# Patient Record
Sex: Female | Born: 1990 | Race: White | Hispanic: No | Marital: Single | Smoking: Current every day smoker
Health system: Southern US, Community
[De-identification: ages and names within clinical notes are randomized; demographics above are authoritative.]

## PROBLEM LIST (undated history)

## (undated) DIAGNOSIS — F419 Anxiety disorder, unspecified: Secondary | ICD-10-CM

## (undated) DIAGNOSIS — F329 Major depressive disorder, single episode, unspecified: Secondary | ICD-10-CM

## (undated) DIAGNOSIS — F32A Depression, unspecified: Secondary | ICD-10-CM

## (undated) HISTORY — DX: Major depressive disorder, single episode, unspecified: F32.9

## (undated) HISTORY — DX: Depression, unspecified: F32.A

## (undated) HISTORY — DX: Anxiety disorder, unspecified: F41.9

---
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
## 2009-11-22 ENCOUNTER — Ambulatory Visit: Payer: Self-pay | Admitting: Family Medicine

## 2010-02-21 ENCOUNTER — Encounter: Payer: Self-pay | Admitting: Family Medicine

## 2010-02-21 ENCOUNTER — Ambulatory Visit: Payer: Self-pay | Admitting: Obstetrics & Gynecology

## 2010-02-21 DIAGNOSIS — Z3480 Encounter for supervision of other normal pregnancy, unspecified trimester: Secondary | ICD-10-CM

## 2010-02-21 LAB — CONVERTED CEMR LAB
Antibody Screen: NEGATIVE
Basophils Relative: 0 % (ref 0–1)
Eosinophils Absolute: 0.1 10*3/uL (ref 0.0–0.7)
Eosinophils Relative: 2 % (ref 0–5)
HCT: 37 % (ref 36.0–46.0)
Lymphs Abs: 1.2 10*3/uL (ref 0.7–4.0)
MCHC: 33.8 g/dL (ref 30.0–36.0)
MCV: 92.5 fL (ref 78.0–100.0)
Monocytes Relative: 8 % (ref 3–12)
Neutrophils Relative %: 72 % (ref 43–77)
Platelets: 192 10*3/uL (ref 150–400)
Rh Type: NEGATIVE
WBC: 6.6 10*3/uL (ref 4.0–10.5)

## 2010-02-28 ENCOUNTER — Ambulatory Visit (HOSPITAL_COMMUNITY): Admission: RE | Admit: 2010-02-28 | Discharge: 2010-02-28 | Payer: Self-pay | Admitting: Family Medicine

## 2010-03-07 ENCOUNTER — Ambulatory Visit: Payer: Self-pay | Admitting: Family Medicine

## 2010-03-08 ENCOUNTER — Encounter: Payer: Self-pay | Admitting: Family Medicine

## 2010-03-08 LAB — CONVERTED CEMR LAB
Chlamydia, Swab/Urine, PCR: NEGATIVE
GC Probe Amp, Urine: NEGATIVE

## 2010-04-10 ENCOUNTER — Ambulatory Visit: Payer: Self-pay | Admitting: Obstetrics & Gynecology

## 2010-05-08 ENCOUNTER — Encounter: Payer: Self-pay | Admitting: Family Medicine

## 2010-05-08 ENCOUNTER — Ambulatory Visit: Payer: Self-pay | Admitting: Obstetrics & Gynecology

## 2010-05-10 ENCOUNTER — Ambulatory Visit (HOSPITAL_COMMUNITY): Admission: RE | Admit: 2010-05-10 | Discharge: 2010-05-10 | Payer: Self-pay | Admitting: Family Medicine

## 2010-06-13 ENCOUNTER — Ambulatory Visit: Payer: Self-pay | Admitting: Obstetrics & Gynecology

## 2010-07-10 ENCOUNTER — Encounter: Payer: Self-pay | Admitting: Family Medicine

## 2010-07-10 ENCOUNTER — Ambulatory Visit: Payer: Self-pay | Admitting: Obstetrics & Gynecology

## 2010-07-10 LAB — CONVERTED CEMR LAB
Antibody Screen: NEGATIVE
HIV: NONREACTIVE
Platelets: 214 10*3/uL (ref 150–400)
RDW: 13.2 % (ref 11.5–15.5)
WBC: 8.6 10*3/uL (ref 4.0–10.5)

## 2010-07-31 ENCOUNTER — Ambulatory Visit: Admit: 2010-07-31 | Payer: Self-pay | Admitting: Family Medicine

## 2010-08-08 ENCOUNTER — Ambulatory Visit
Admission: RE | Admit: 2010-08-08 | Discharge: 2010-08-08 | Payer: Self-pay | Source: Home / Self Care | Attending: Family Medicine | Admitting: Family Medicine

## 2010-08-16 ENCOUNTER — Observation Stay: Payer: Self-pay | Admitting: Obstetrics and Gynecology

## 2010-08-23 ENCOUNTER — Encounter: Payer: Medicaid Other | Admitting: Obstetrics & Gynecology

## 2010-08-23 DIAGNOSIS — O99330 Smoking (tobacco) complicating pregnancy, unspecified trimester: Secondary | ICD-10-CM

## 2010-08-23 DIAGNOSIS — Z3480 Encounter for supervision of other normal pregnancy, unspecified trimester: Secondary | ICD-10-CM

## 2010-09-11 ENCOUNTER — Ambulatory Visit (HOSPITAL_COMMUNITY)
Admission: RE | Admit: 2010-09-11 | Discharge: 2010-09-11 | Disposition: A | Discharge status: Home / Self Care | Payer: Medicaid Other | Source: Ambulatory Visit | Attending: Obstetrics & Gynecology | Admitting: Obstetrics & Gynecology

## 2010-09-11 ENCOUNTER — Encounter: Payer: Medicaid Other | Admitting: Obstetrics and Gynecology

## 2010-09-11 ENCOUNTER — Other Ambulatory Visit: Payer: Self-pay | Admitting: Obstetrics & Gynecology

## 2010-09-11 ENCOUNTER — Inpatient Hospital Stay (HOSPITAL_COMMUNITY)
Admission: AD | Admit: 2010-09-11 | Discharge: 2010-09-11 | Disposition: A | Discharge status: Home / Self Care | Payer: Medicaid Other | Source: Ambulatory Visit | Attending: Obstetrics & Gynecology | Admitting: Obstetrics & Gynecology

## 2010-09-11 DIAGNOSIS — IMO0002 Reserved for concepts with insufficient information to code with codable children: Secondary | ICD-10-CM

## 2010-09-11 DIAGNOSIS — Z3689 Encounter for other specified antenatal screening: Secondary | ICD-10-CM | POA: Insufficient documentation

## 2010-09-11 DIAGNOSIS — O368390 Maternal care for abnormalities of the fetal heart rate or rhythm, unspecified trimester, not applicable or unspecified: Secondary | ICD-10-CM | POA: Insufficient documentation

## 2010-09-11 DIAGNOSIS — O288 Other abnormal findings on antenatal screening of mother: Secondary | ICD-10-CM

## 2010-09-11 DIAGNOSIS — Z3480 Encounter for supervision of other normal pregnancy, unspecified trimester: Secondary | ICD-10-CM

## 2010-09-12 ENCOUNTER — Other Ambulatory Visit (HOSPITAL_COMMUNITY): Payer: Medicaid Other

## 2010-09-17 ENCOUNTER — Encounter: Payer: Medicaid Other | Admitting: Obstetrics & Gynecology

## 2010-09-17 ENCOUNTER — Encounter: Payer: Self-pay | Admitting: Obstetrics and Gynecology

## 2010-09-17 DIAGNOSIS — Z3480 Encounter for supervision of other normal pregnancy, unspecified trimester: Secondary | ICD-10-CM

## 2010-09-17 LAB — CONVERTED CEMR LAB: Chlamydia, DNA Probe: NEGATIVE

## 2010-09-18 ENCOUNTER — Encounter: Payer: Self-pay | Admitting: Obstetrics and Gynecology

## 2010-09-26 ENCOUNTER — Encounter: Payer: Medicaid Other | Admitting: Obstetrics & Gynecology

## 2010-09-26 DIAGNOSIS — Z3480 Encounter for supervision of other normal pregnancy, unspecified trimester: Secondary | ICD-10-CM

## 2010-09-26 DIAGNOSIS — O99330 Smoking (tobacco) complicating pregnancy, unspecified trimester: Secondary | ICD-10-CM

## 2010-09-30 ENCOUNTER — Inpatient Hospital Stay: Payer: Self-pay

## 2010-09-30 ENCOUNTER — Observation Stay: Payer: Self-pay | Admitting: Obstetrics and Gynecology

## 2010-09-30 ENCOUNTER — Inpatient Hospital Stay (HOSPITAL_COMMUNITY): Payer: Medicaid Other

## 2010-09-30 ENCOUNTER — Inpatient Hospital Stay (HOSPITAL_COMMUNITY)
Admission: AD | Admit: 2010-09-30 | Discharge: 2010-09-30 | Disposition: A | Discharge status: Home / Self Care | Payer: Medicaid Other | Source: Ambulatory Visit | Attending: Obstetrics & Gynecology | Admitting: Obstetrics & Gynecology

## 2010-09-30 DIAGNOSIS — O99891 Other specified diseases and conditions complicating pregnancy: Secondary | ICD-10-CM | POA: Insufficient documentation

## 2010-10-02 ENCOUNTER — Encounter: Payer: Medicaid Other | Admitting: Obstetrics and Gynecology

## 2010-10-04 LAB — PATHOLOGY REPORT

## 2010-11-06 ENCOUNTER — Ambulatory Visit: Payer: Medicaid Other | Admitting: Obstetrics and Gynecology

## 2010-11-06 ENCOUNTER — Other Ambulatory Visit (HOSPITAL_COMMUNITY)
Admission: RE | Admit: 2010-11-06 | Discharge: 2010-11-06 | Disposition: A | Discharge status: Home / Self Care | Payer: Medicaid Other | Source: Ambulatory Visit | Attending: Obstetrics & Gynecology | Admitting: Obstetrics & Gynecology

## 2010-11-06 DIAGNOSIS — Z01419 Encounter for gynecological examination (general) (routine) without abnormal findings: Secondary | ICD-10-CM

## 2010-11-06 DIAGNOSIS — Z113 Encounter for screening for infections with a predominantly sexual mode of transmission: Secondary | ICD-10-CM | POA: Insufficient documentation

## 2010-11-27 NOTE — Assessment & Plan Note (Signed)
NAMEMODELL, FENDRICK           ACCOUNT NO.:  [id number]   MEDICAL RECORD NO.:  [id number]          PATIENT TYPE:  POB   LOCATION:  [facility] at [facility]         FACILITY:  [facility]   PHYSICIAN:  Scheryl Darter, MD       DATE OF BIRTH:  03/01/1991   DATE OF SERVICE:  11/23/2008                                  CLINIC NOTE   The patient was seen on May 3 at [facility] due to  left lower quadrant pain.  She said her urine was quite dark.  She was  treated for urinary tract infection with Cipro.  Pain is much better.  She still has some slight burning with urination.  She denies any  discharge.  The patient is a 20 year old, white female, gravida 0.  Last  menstrual period November 07, 2008.  She currently occasionally uses  condoms for birth control.   PAST MEDICAL HISTORY:  Bladder infections.   PAST SURGICAL HISTORY:  None.   SOCIAL HISTORY:  The patient is single and smokes a pack of cigarettes a  day for last 2 years.  She denies any abuse or illicit drug use or  alcohol use.   FAMILY HISTORY:  Diabetes, heart disease, hypertension, and blood clots.   ALLERGIES:  No known drug allergies.   MEDICATIONS:  None.   PHYSICAL EXAMINATION:  GENERAL:  The patient is in acute distress.  VITAL SIGNS:  Weight is 135 pounds, BP 111/72, pulse 80.  ABDOMEN:  Nondistended, nontender.  EXTERNAL GENITALIA:  Vagina and cervix showed a whitish discharge that  may represent yeast.  No cervical motion tenderness.  Uterus normal in  size.  No adnexal masses or tenderness.   We are unable to get the records from [facility] to  review during her visit.  I need to followup an ultrasound that showed a  left ovarian cyst.  The wet mount is pending as of urine culture.  As  her exam seemed to be consistent with vaginal yeast, I gave her  prescription for Diflucan 150 mg 1 tablet.  We will be in touch with the  results of her testing.  A gonorrhea and Chlamydia probes  were obtained  today.  I advised her that she should quit smoking and also she should  consider reliable method of birth control as she is likely to desire  pregnancy.      Scheryl Darter, MD     JA/MEDQ  D:  11/23/2008  T:  11/23/2008  Job:  [id number]

## 2011-06-27 IMAGING — US US OB COMP LESS 14 WK
1 series · 14 of 28 positions shown · non-contrast
Comparison: none

OBSTETRICAL ULTRASOUND:
 This ultrasound was performed in The [HOSPITAL], and the [facility] report will be stored to [REDACTED] PACS.  This report is also available in [HOSPITAL]?s accessANYware.

[Series 1: us ob comp less 14 wks · 14 of 34 slices shown]
[im 2/34]
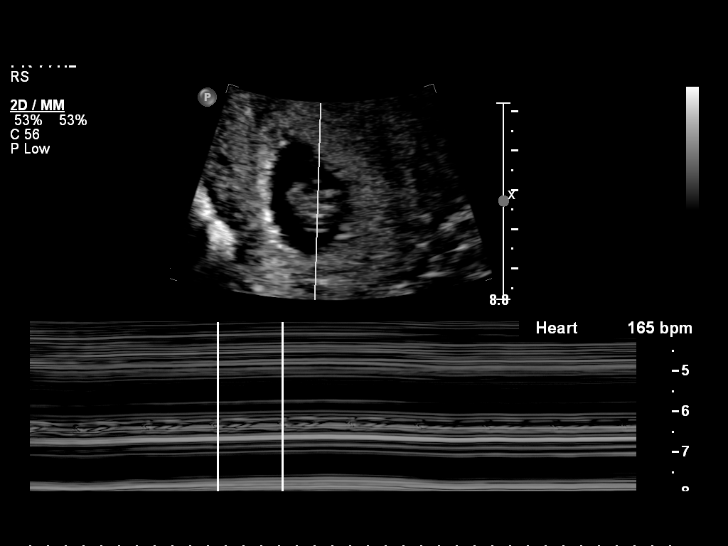
[im 4/34]
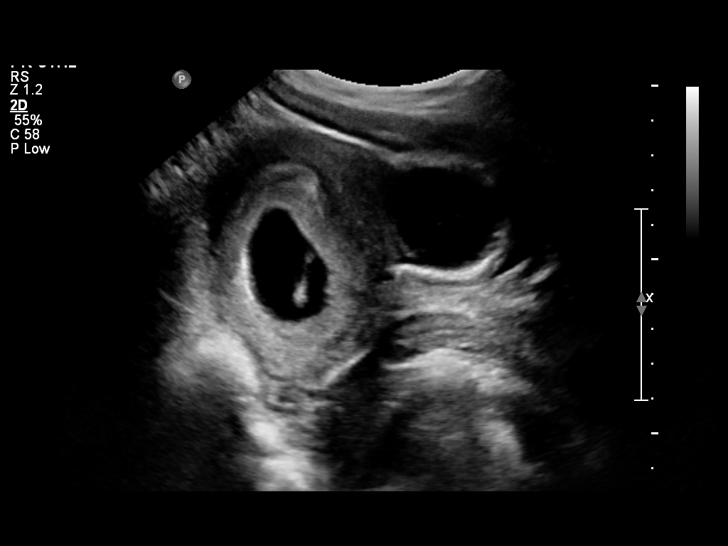
[im 7/34]
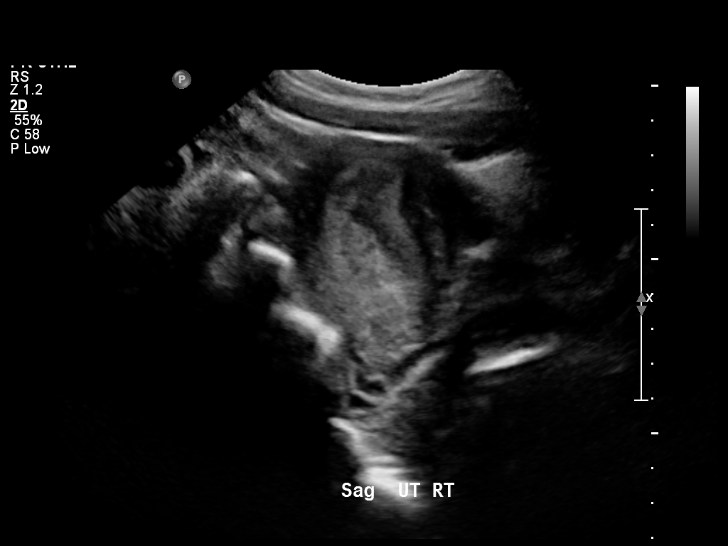
[im 9/34]
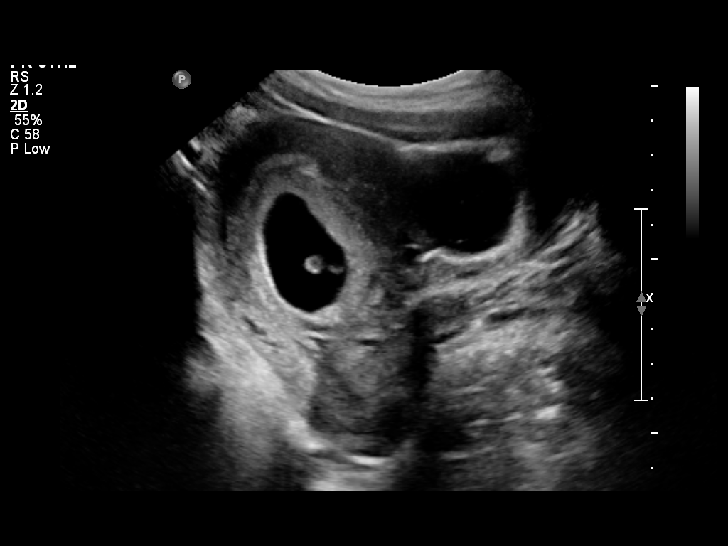
[im 12/34]
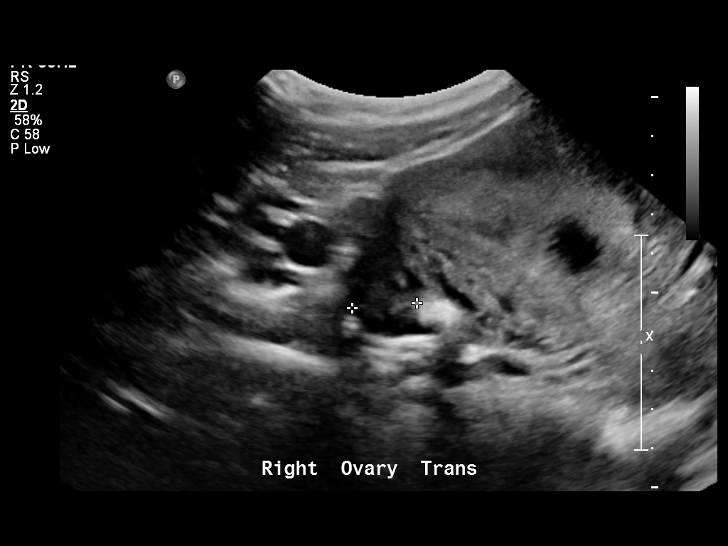
[im 14/34]
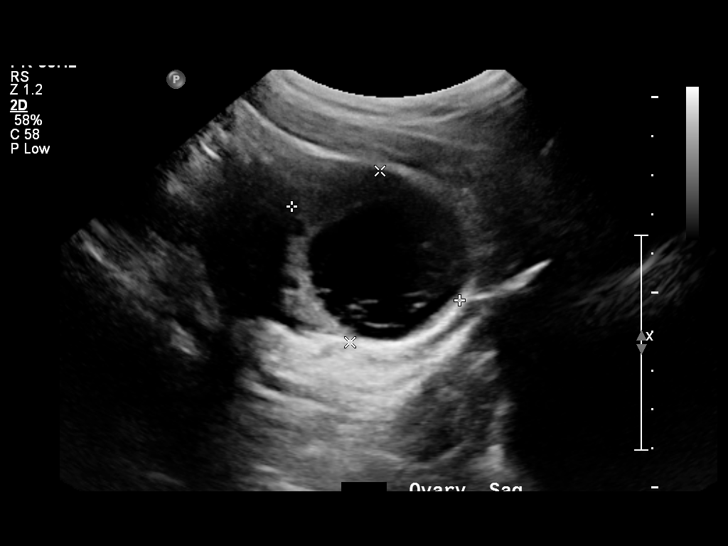
[im 16/34]
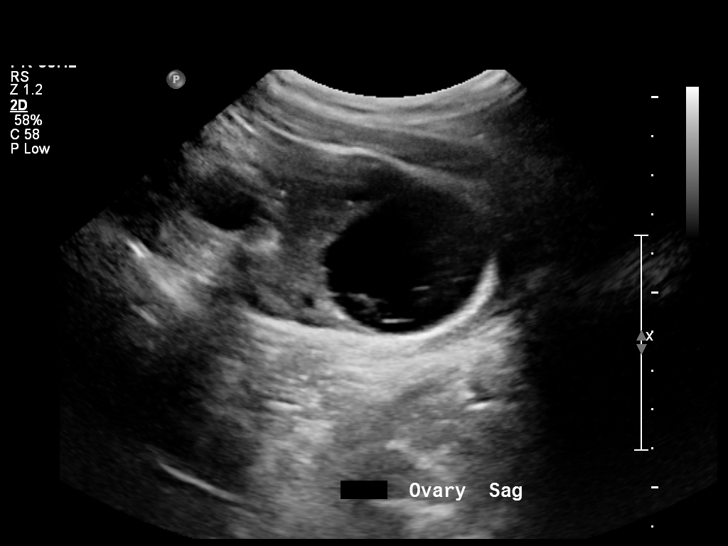
[im 19/34]
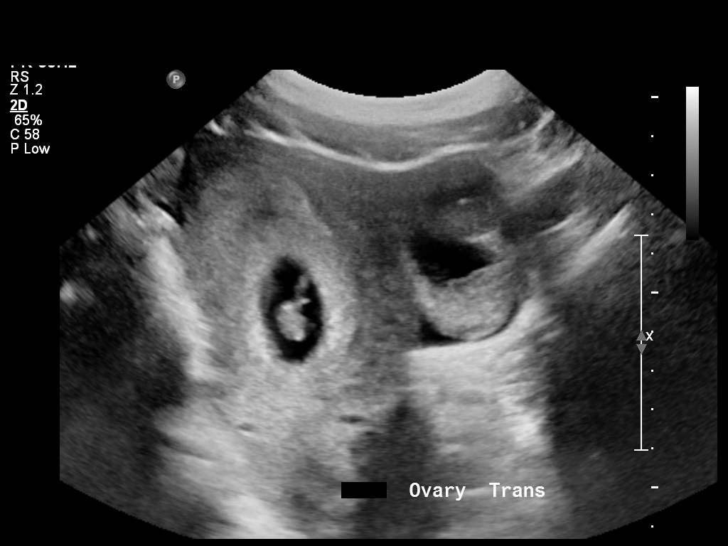
[im 21/34]
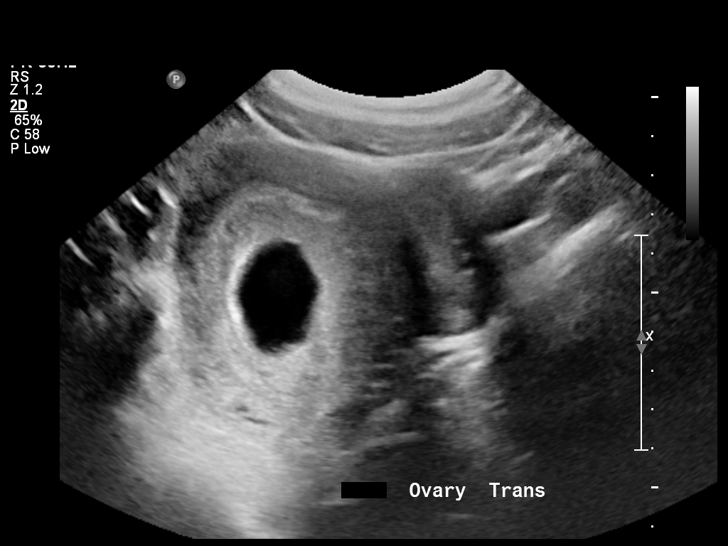
[im 24/34]
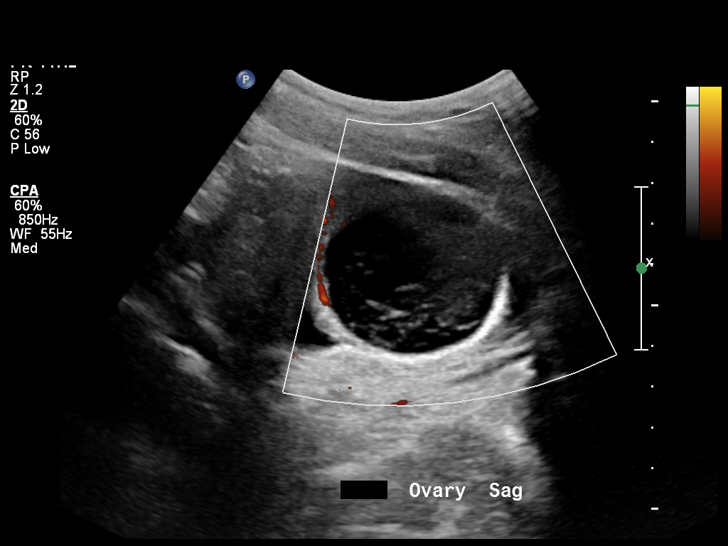
[im 26/34]
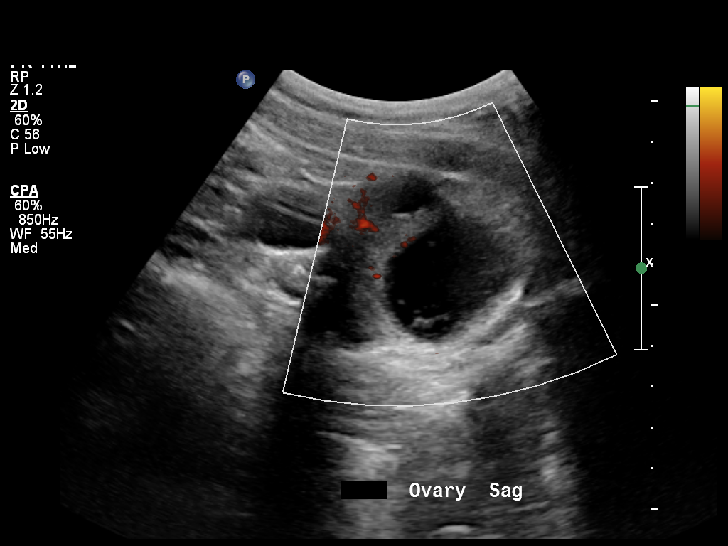
[im 29/34]
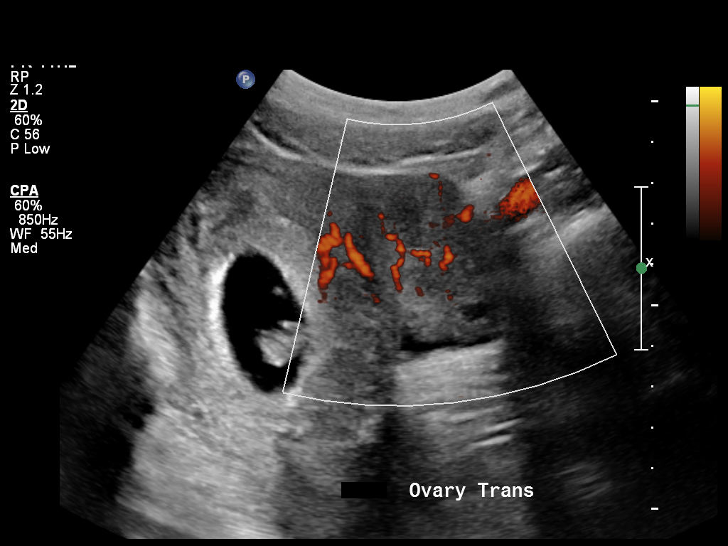
[im 31/34]
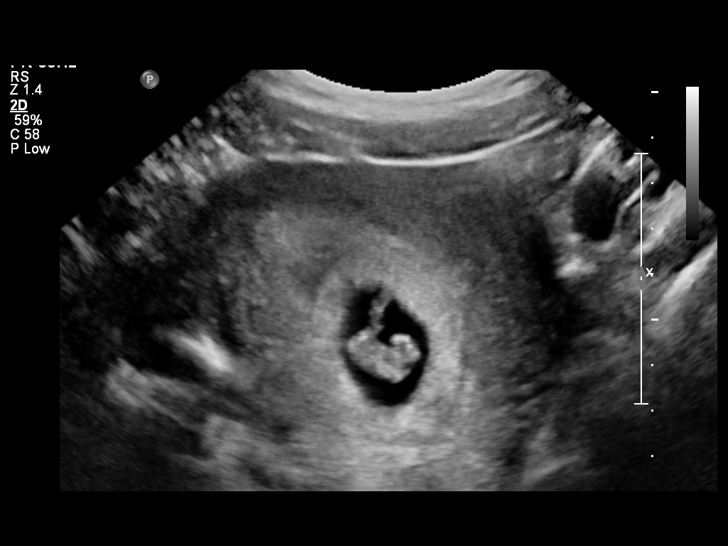
[im 34/34]
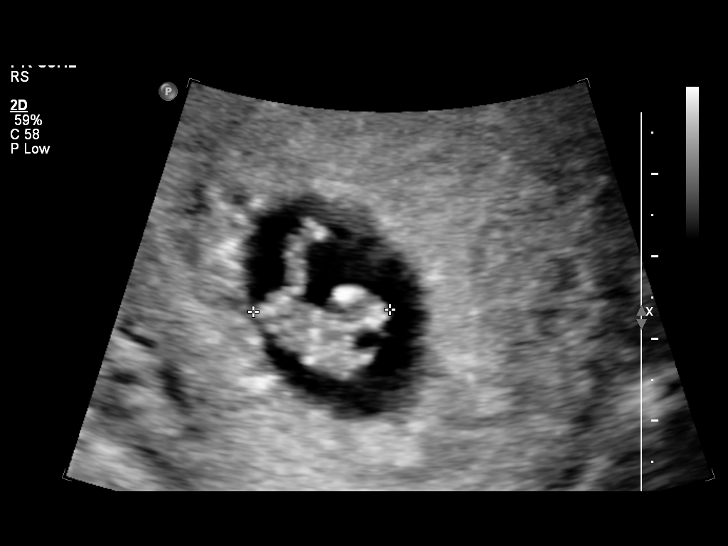

[14 of 28 positions shown; findings below may reference images not displayed]

IMPRESSION: [facility] has also been faxed to the ordering physician.

## 2011-09-01 ENCOUNTER — Emergency Department (HOSPITAL_COMMUNITY)
Admission: EM | Admit: 2011-09-01 | Discharge: 2011-09-02 | Disposition: A | Discharge status: Home / Self Care | Payer: BC Managed Care – PPO | Attending: Emergency Medicine | Admitting: Emergency Medicine

## 2011-09-01 ENCOUNTER — Encounter (HOSPITAL_COMMUNITY): Payer: Self-pay | Admitting: *Deleted

## 2011-09-01 DIAGNOSIS — F17200 Nicotine dependence, unspecified, uncomplicated: Secondary | ICD-10-CM | POA: Insufficient documentation

## 2011-09-01 DIAGNOSIS — R109 Unspecified abdominal pain: Secondary | ICD-10-CM | POA: Insufficient documentation

## 2011-09-01 LAB — URINALYSIS, ROUTINE W REFLEX MICROSCOPIC
Bilirubin Urine: NEGATIVE
Glucose, UA: NEGATIVE mg/dL
Hgb urine dipstick: NEGATIVE
Ketones, ur: NEGATIVE mg/dL
Leukocytes, UA: NEGATIVE
Protein, ur: NEGATIVE mg/dL
pH: 6.5 (ref 5.0–8.0)

## 2011-09-01 NOTE — ED Notes (Signed)
Pt states that she noted her "stomach hurt" in the lower aspect earlier today.  Pt states that the pain has now moved to the upper aspect.  Pt denies N/V/D.  Pt states that the pain started circa 1700 today.

## 2011-09-02 LAB — COMPREHENSIVE METABOLIC PANEL
Albumin: 4.1 g/dL (ref 3.5–5.2)
Alkaline Phosphatase: 50 U/L (ref 39–117)
BUN: 13 mg/dL (ref 6–23)
CO2: 27 mEq/L (ref 19–32)
Chloride: 102 mEq/L (ref 96–112)
GFR calc non Af Amer: >90 mL/min (ref >90)
Glucose, Bld: 87 mg/dL (ref 70–99)
Potassium: 5.1 mEq/L (ref 3.5–5.1)
Total Bilirubin: 0.2 mg/dL — ABNORMAL LOW (ref 0.3–1.2)

## 2011-09-02 LAB — CBC
MCV: 89.6 fL (ref 78.0–100.0)
Platelets: 248 10*3/uL (ref 150–400)
RDW: 12.7 % (ref 11.5–15.5)
WBC: 7.2 10*3/uL (ref 4.0–10.5)

## 2011-09-02 LAB — DIFFERENTIAL
Basophils Absolute: 0.1 10*3/uL (ref 0.0–0.1)
Eosinophils Absolute: 0.3 10*3/uL (ref 0.0–0.7)
Eosinophils Relative: 4 % (ref 0–5)
Lymphocytes Relative: 39 % (ref 12–46)
Neutrophils Relative %: 49 % (ref 43–77)

## 2011-09-02 NOTE — Discharge Instructions (Signed)
Abdominal Pain (Nonspecific) Your exam might not show the exact reason you have abdominal pain. Since there are many different causes of abdominal pain, another checkup and more tests may be needed. It is very important to follow up for lasting (persistent) or worsening symptoms. A possible cause of abdominal pain in any person who still has his or her appendix is acute appendicitis. Appendicitis is often hard to diagnose. Normal blood tests, urine tests, ultrasound, and CT scans do not completely rule out early appendicitis or other causes of abdominal pain. Sometimes, only the changes that happen over time will allow appendicitis and other causes of abdominal pain to be determined. Other potential problems that may require surgery may also take time to become more apparent. Because of this, it is important that you follow all of the instructions below. HOME CARE INSTRUCTIONS   Rest as much as possible.   Do not eat solid food until your pain is gone.   While adults or children have pain: A diet of water, weak decaffeinated tea, broth or bouillon, gelatin, oral rehydration solutions (ORS), frozen ice pops, or ice chips may be helpful.   When pain is gone in adults or children: Start a light diet (dry toast, crackers, applesauce, or white rice). Increase the diet slowly as long as it does not bother you. Eat no dairy products (including cheese and eggs) and no spicy, fatty, fried, or high-fiber foods.   Use no alcohol, caffeine, or cigarettes.   Take your regular medicines unless your caregiver told you not to.   Take any prescribed medicine as directed.   Only take over-the-counter or prescription medicines for pain, discomfort, or fever as directed by your caregiver. Do not give aspirin to children.  If your caregiver has given you a follow-up appointment, it is very important to keep that appointment. Not keeping the appointment could result in a permanent injury and/or lasting (chronic) pain  and/or disability. If there is any problem keeping the appointment, you must call to reschedule.  SEEK IMMEDIATE MEDICAL CARE IF:   Your pain is not gone in 24 hours.   Your pain becomes worse, changes location, or feels different.   You or your child has an oral temperature above 102 F (38.9 C), not controlled by medicine.   Your baby is older than 3 months with a rectal temperature of 102 F (38.9 C) or higher.   Your baby is 56 months old or younger with a rectal temperature of 100.4 F (38 C) or higher.   You have shaking chills.   You keep throwing up (vomiting) or cannot drink liquids.   There is blood in your vomit or you see blood in your bowel movements.   Your bowel movements become dark or black.   You have frequent bowel movements.   Your bowel movements stop (become blocked) or you cannot pass gas.   You have bloody, frequent, or painful urination.   You have yellow discoloration in the skin or whites of the eyes.   Your stomach becomes bloated or bigger.   You have dizziness or fainting.   You have chest or back pain.  MAKE SURE YOU:   Understand these instructions.   Will watch your condition.   Will get help right away if you are not doing well or get worse.  Document Released: 07/01/2005 Document Revised: 03/13/2011 Document Reviewed: 05/29/2009 [facility] Patient Information 2012 [place], [place]. Denies your labs are normal.  Urine is normal at the time of  your exam your pain result a few again developed pain, fever, nausea, vomiting, diarrhea.  Please return for further evaluation

## 2011-09-02 NOTE — ED Notes (Signed)
A rainbow is in lab on patient.

## 2011-09-02 NOTE — ED Notes (Signed)
Patient stable upon discharge.  

## 2011-09-02 NOTE — ED Provider Notes (Signed)
History     CSN: [id number]  Arrival date & time 09/01/11  2212   First MD Initiated Contact with Patient 09/02/11 0225      Chief Complaint  Patient presents with  . Abdominal Pain    (Consider location/radiation/quality/duration/timing/severity/associated sxs/prior treatment) Patient is a 21 y.o. female presenting with abdominal pain. The history is provided by the patient.  Abdominal Pain The primary symptoms of the illness include abdominal pain. The primary symptoms of the illness do not include nausea, vomiting, diarrhea, dysuria or vaginal discharge. The current episode started 6 to 12 hours ago. The problem has been resolved.    History reviewed. No pertinent past medical history.  History reviewed. No pertinent past surgical history.  No family history on file.  History  Substance Use Topics  . Smoking status: Current Everyday Smoker -- 1.0 packs/day for 5 years    Types: Cigarettes  . Smokeless tobacco: Not on file  . Alcohol Use: No    OB History    Grav Para Term Preterm Abortions TAB SAB Ect Mult Living                  Review of Systems  Gastrointestinal: Positive for abdominal pain. Negative for nausea, vomiting and diarrhea.  Genitourinary: Negative for dysuria and vaginal discharge.    Allergies  Review of patient's allergies indicates no known allergies.  Home Medications  No current outpatient prescriptions on file.  BP 96/52  Pulse 61  Temp(Src) 98.1 F (36.7 C) (Oral)  Resp 17  Ht 5\' 5"  (1.651 m)  Wt 146 lb 12.8 oz (66.588 kg)  BMI 24.43 kg/m2  SpO2 98%  LMP 09/01/2011  Physical Exam  Constitutional: She is oriented to person, place, and time. She appears well-developed and well-nourished.  HENT:  Head: Normocephalic.  Eyes: Pupils are equal, round, and reactive to light.  Neck: Normal range of motion.  Cardiovascular: Normal rate.   Pulmonary/Chest: Effort normal.  Abdominal: Soft. Bowel sounds are normal. She exhibits no  distension. There is no tenderness.  Neurological: She is alert and oriented to person, place, and time.  Skin: Skin is warm and dry.    ED Course  Procedures (including critical care time)  Labs Reviewed  COMPREHENSIVE METABOLIC PANEL - Abnormal; Notable for the following:    Total Bilirubin 0.2 (*)    All other components within normal limits  URINALYSIS, ROUTINE W REFLEX MICROSCOPIC  PREGNANCY, URINE  CBC  DIFFERENTIAL   No results found.   1. Abdominal pain of unknown etiology       MDM  Resolved, migratory abdominal pain        Arman Filter, NP 09/02/11 0406  Arman Filter, NP 09/02/11 0406  Medical screening examination/treatment/procedure(s) were performed by non-physician practitioner and as supervising physician I was immediately available for consultation/collaboration.  Sunnie Nielsen, MD 09/03/11 [phone]

## 2012-06-02 ENCOUNTER — Other Ambulatory Visit: Payer: BC Managed Care – PPO | Admitting: *Deleted

## 2012-06-02 NOTE — Progress Notes (Signed)
Patient came in to confirm her pregnancy.  Request informal ultrasound to see how far along she might be.  Lmp was October 12.  Gestational sac measures 5 wks and 3 days.  She is considering whether or not to move forward with the pregnancy.  She will call back once she makes up her mind.

## 2012-06-08 ENCOUNTER — Telehealth: Payer: Self-pay | Admitting: *Deleted

## 2012-06-08 DIAGNOSIS — N390 Urinary tract infection, site not specified: Secondary | ICD-10-CM

## 2012-06-08 MED ORDER — NITROFURANTOIN MONOHYD MACRO 100 MG PO CAPS: 100.0000 mg | ORAL_CAPSULE | Freq: Two times a day (BID) | ORAL | Status: DC

## 2012-06-08 NOTE — Telephone Encounter (Signed)
Patient has a uti pain and burning with urination and she would like to have something called in.  She just found out she was pregnant and is working on getting her pregnancy medicaid in order prior to coming in for her first ob visit.

## 2012-11-24 ENCOUNTER — Ambulatory Visit: Payer: BC Managed Care – PPO | Admitting: Obstetrics and Gynecology

## 2012-12-08 ENCOUNTER — Ambulatory Visit (INDEPENDENT_AMBULATORY_CARE_PROVIDER_SITE_OTHER): Payer: Self-pay | Admitting: Family Medicine

## 2012-12-08 ENCOUNTER — Ambulatory Visit: Payer: BC Managed Care – PPO

## 2012-12-08 ENCOUNTER — Encounter: Payer: Self-pay | Admitting: Family Medicine

## 2012-12-08 VITALS — BP 104/66 | HR 85 | Ht 65.0 in | Wt 144.0 lb

## 2012-12-08 DIAGNOSIS — B9689 Other specified bacterial agents as the cause of diseases classified elsewhere: Secondary | ICD-10-CM | POA: Insufficient documentation

## 2012-12-08 DIAGNOSIS — N760 Acute vaginitis: Secondary | ICD-10-CM

## 2012-12-08 DIAGNOSIS — N390 Urinary tract infection, site not specified: Secondary | ICD-10-CM | POA: Insufficient documentation

## 2012-12-08 DIAGNOSIS — R300 Dysuria: Secondary | ICD-10-CM

## 2012-12-08 DIAGNOSIS — A499 Bacterial infection, unspecified: Secondary | ICD-10-CM

## 2012-12-08 LAB — POCT URINALYSIS DIPSTICK
Bilirubin, UA: NEGATIVE
Nitrite, UA: NEGATIVE
Protein, UA: NEGATIVE
pH, UA: 5

## 2012-12-08 MED ORDER — METRONIDAZOLE 500 MG PO TABS: 500.0000 mg | ORAL_TABLET | Freq: Two times a day (BID) | ORAL | Status: AC

## 2012-12-08 MED ORDER — PHENAZOPYRIDINE HCL 95 MG PO TABS: 95.0000 mg | ORAL_TABLET | Freq: Three times a day (TID) | ORAL | Status: DC | PRN

## 2012-12-08 MED ORDER — CIPROFLOXACIN HCL 500 MG PO TABS: 500.0000 mg | ORAL_TABLET | Freq: Two times a day (BID) | ORAL | Status: DC

## 2012-12-08 NOTE — Assessment & Plan Note (Signed)
Send for culture and treat presumptively + pyridium.

## 2012-12-08 NOTE — Patient Instructions (Signed)
Bacterial Vaginosis Bacterial vaginosis (BV) is a vaginal infection where the normal balance of bacteria in the vagina is disrupted. The normal balance is then replaced by an overgrowth of certain bacteria. There are several different kinds of bacteria that can cause BV. BV is the most common vaginal infection in women of childbearing age. CAUSES   The cause of BV is not fully understood. BV develops when there is an increase or imbalance of harmful bacteria.  Some activities or behaviors can upset the normal balance of bacteria in the vagina and put women at increased risk including:  Having a new sex partner or multiple sex partners.  Douching.  Using an intrauterine device (IUD) for contraception.  It is not clear what role sexual activity plays in the development of BV. However, women that have never had sexual intercourse are rarely infected with BV. Women do not get BV from toilet seats, bedding, swimming pools or from touching objects around them.  SYMPTOMS   Grey vaginal discharge.  A fish-like odor with discharge, especially after sexual intercourse.  Itching or burning of the vagina and vulva.  Burning or pain with urination.  Some women have no signs or symptoms at all. DIAGNOSIS  Your caregiver must examine the vagina for signs of BV. Your caregiver will perform lab tests and look at the sample of vaginal fluid through a microscope. They will look for bacteria and abnormal cells (clue cells), a pH test higher than 4.5, and a positive amine test all associated with BV.  RISKS AND COMPLICATIONS   Pelvic inflammatory disease (PID).  Infections following gynecology surgery.  Developing HIV.  Developing herpes virus. TREATMENT  Sometimes BV will clear up without treatment. However, all women with symptoms of BV should be treated to avoid complications, especially if gynecology surgery is planned. Female partners generally do not need to be treated. However, BV may spread  between female sex partners so treatment is helpful in preventing a recurrence of BV.   BV may be treated with antibiotics. The antibiotics come in either pill or vaginal cream forms. Either can be used with nonpregnant or pregnant women, but the recommended dosages differ. These antibiotics are not harmful to the baby.  BV can recur after treatment. If this happens, a second round of antibiotics will often be prescribed.  Treatment is important for pregnant women. If not treated, BV can cause a premature delivery, especially for a pregnant woman who had a premature birth in the past. All pregnant women who have symptoms of BV should be checked and treated.  For chronic reoccurrence of BV, treatment with a type of prescribed gel vaginally twice a week is helpful. HOME CARE INSTRUCTIONS   Finish all medication as directed by your caregiver.  Do not have sex until treatment is completed.  Tell your sexual partner that you have a vaginal infection. They should see their caregiver and be treated if they have problems, such as a mild rash or itching.  Practice safe sex. Use condoms. Only have 1 sex partner. PREVENTION  Basic prevention steps can help reduce the risk of upsetting the natural balance of bacteria in the vagina and developing BV:  Do not have sexual intercourse (be abstinent).  Do not douche.  Use all of the medicine prescribed for treatment of BV, even if the signs and symptoms go away.  Tell your sex partner if you have BV. That way, they can be treated, if needed, to prevent reoccurrence. SEEK MEDICAL CARE IF:     prescribed for treatment of BV, even if the signs and symptoms go away.   Tell your sex partner if you have BV. That way, they can be treated, if needed, to prevent reoccurrence.  SEEK MEDICAL CARE IF:    Your symptoms are not improving after 3 days of treatment.   You have increased discharge, pain, or fever.  MAKE SURE YOU:    Understand these instructions.   Will watch your condition.   Will get help right away if you are not doing well or get worse.  FOR MORE INFORMATION   Division of STD Prevention (DSTDP), Centers for Disease Control and Prevention:  www.cdc.gov/std  American Social Health Association (ASHA): www.ashastd.org   Document Released: 07/01/2005 Document Revised: 09/23/2011 Document Reviewed: 12/22/2008  [employer] Patient Information 2014 [employer].  Urinary Tract Infection  Urinary tract infections (UTIs) can develop anywhere along your urinary tract. Your urinary tract is your body's drainage system for removing wastes and extra water. Your urinary tract includes two kidneys, two ureters, a bladder, and a urethra. Your kidneys are a pair of bean-shaped organs. Each kidney is about the size of your fist. They are located below your ribs, one on each side of your spine.  CAUSES  Infections are caused by microbes, which are microscopic organisms, including fungi, viruses, and bacteria. These organisms are so small that they can only be seen through a microscope. Bacteria are the microbes that most commonly cause UTIs.  SYMPTOMS   Symptoms of UTIs may vary by age and gender of the patient and by the location of the infection. Symptoms in young women typically include a frequent and intense urge to urinate and a painful, burning feeling in the bladder or urethra during urination. Older women and men are more likely to be tired, shaky, and weak and have muscle aches and abdominal pain. A fever may mean the infection is in your kidneys. Other symptoms of a kidney infection include pain in your back or sides below the ribs, nausea, and vomiting.  DIAGNOSIS  To diagnose a UTI, your caregiver will ask you about your symptoms. Your caregiver also will ask to provide a urine sample. The urine sample will be tested for bacteria and white blood cells. White blood cells are made by your body to help fight infection.  TREATMENT   Typically, UTIs can be treated with medication. Because most UTIs are caused by a bacterial infection, they usually can be treated with the use of antibiotics. The choice of antibiotic and length of treatment depend on your symptoms  and the type of bacteria causing your infection.  HOME CARE INSTRUCTIONS   If you were prescribed antibiotics, take them exactly as your caregiver instructs you. Finish the medication even if you feel better after you have only taken some of the medication.   Drink enough water and fluids to keep your urine clear or pale yellow.   Avoid caffeine, tea, and carbonated beverages. They tend to irritate your bladder.   Empty your bladder often. Avoid holding urine for long periods of time.   Empty your bladder before and after sexual intercourse.   After a bowel movement, women should cleanse from front to back. Use each tissue only once.  SEEK MEDICAL CARE IF:    You have back pain.   You develop a fever.   Your symptoms do not begin to resolve within 3 days.  SEEK IMMEDIATE MEDICAL CARE IF:    You have severe back pain or lower

## 2012-12-08 NOTE — Progress Notes (Signed)
  Subjective:    Patient ID: Chelsea Joseph, female    DOB: 1990/10/12, 22 y.o.   MRN: [id number]  Dysuria  This is a new problem. The current episode started 1 to 4 weeks ago. The problem occurs every urination. The problem has been gradually worsening. The quality of the pain is described as aching and burning. The pain is mild. The maximum temperature recorded prior to her arrival was 101 - 101.9 F. The fever has been present for less than 1 day. She is sexually active. There is no history of pyelonephritis. Associated symptoms include a discharge. Pertinent negatives include no chills. She has tried increased fluids for the symptoms. The treatment provided no relief. Her past medical history is significant for recurrent UTIs.      Review of Systems  Constitutional: Negative for chills.  Genitourinary: Positive for dysuria.       Objective:   Physical Exam  Vitals reviewed. Constitutional: She is oriented to person, place, and time. She appears well-developed and well-nourished.  HENT:  Head: Normocephalic and atraumatic.  Neck: Neck supple.  Cardiovascular: Normal rate and regular rhythm.   Pulmonary/Chest: Effort normal and breath sounds normal.  Abdominal: Soft. There is no tenderness.  Musculoskeletal: She exhibits no tenderness. Edema: no CVA tenderness.  Neurological: She is alert and oriented to person, place, and time.  Skin: Skin is warm and dry.  Psychiatric: She has a normal mood and affect. Her behavior is normal.          Assessment & Plan:

## 2012-12-08 NOTE — Progress Notes (Signed)
Patient has been having pain for 3 weeks in right lower back as well as burning with urination and foul smell.  She has not had a period in two months but has been under an extreme amount of stress.  Last cycle was March 22.  Last night she had a fever of 101.3.

## 2012-12-08 NOTE — Assessment & Plan Note (Signed)
Per pt. Report-she has this--will check GC/Chlam as well and treat presumptively.

## 2013-05-07 ENCOUNTER — Inpatient Hospital Stay: Payer: Self-pay | Admitting: Obstetrics & Gynecology

## 2013-05-07 LAB — CBC WITH DIFFERENTIAL/PLATELET
Basophil #: 0.1 10*3/uL (ref 0.0–0.1)
Basophil %: 0.4 %
Eosinophil #: 0.1 10*3/uL (ref 0.0–0.7)
Eosinophil %: 0.7 %
HCT: 40.8 % (ref 35.0–47.0)
Lymphocyte %: 6.4 %
MCV: 90 fL (ref 80–100)

## 2013-05-07 LAB — URINALYSIS, COMPLETE
Glucose,UR: NEGATIVE mg/dL (ref 0–75)
Nitrite: POSITIVE
Ph: 6 (ref 4.5–8.0)
RBC,UR: 24 /HPF (ref 0–5)
Specific Gravity: 1.014 (ref 1.003–1.030)

## 2013-05-07 LAB — DRUG SCREEN, URINE
Amphetamines, Ur Screen: NEGATIVE (ref ?–1000)
Barbiturates, Ur Screen: NEGATIVE (ref ?–200)
Benzodiazepine, Ur Scrn: NEGATIVE (ref ?–200)
Cocaine Metabolite,Ur ~~LOC~~: POSITIVE (ref ?–300)
MDMA (Ecstasy)Ur Screen: NEGATIVE (ref ?–500)

## 2013-05-07 LAB — BASIC METABOLIC PANEL
Anion Gap: 5 — ABNORMAL LOW (ref 7–16)
Calcium, Total: 8.9 mg/dL (ref 8.5–10.1)
Osmolality: 269 (ref 275–301)
Sodium: 135 mmol/L — ABNORMAL LOW (ref 136–145)

## 2013-07-20 ENCOUNTER — Emergency Department: Payer: Self-pay | Admitting: Emergency Medicine

## 2013-11-30 ENCOUNTER — Emergency Department: Payer: Self-pay | Admitting: Emergency Medicine

## 2013-12-03 ENCOUNTER — Emergency Department: Payer: Self-pay | Admitting: Emergency Medicine

## 2014-08-28 ENCOUNTER — Observation Stay: Payer: Self-pay | Admitting: Obstetrics & Gynecology

## 2014-09-16 ENCOUNTER — Inpatient Hospital Stay: Payer: Self-pay | Admitting: Obstetrics and Gynecology

## 2014-11-22 NOTE — H&P (Signed)
L&D Evaluation:  History Expanded:  HPI 24 yo G2P1001 at 53w5dgestational age by LMP consistent with 12 week ultrasound.  Pregnancy complicated by patient being a smoker, she also has history of marijuana use prior to pregnancy, which she denies at this time. She presents with regular uterine contractions.  She notes positive fetal movement, no leakage of fluid, no vaginal bleeding.   Blood Type (Maternal) AB negative   Group B Strep Results Maternal (Result >5wks must be treated as unknown) negative   Maternal HIV Negative   Maternal Syphilis Ab Nonreactive   Maternal Varicella Immune   Rubella Results (Maternal) equivocal   [facility]   Patient's Medical History 1) anxiety/depression, 2) history of marijuana use   Patient's Surgical History none   Medications Pre Natal Vitamins   Allergies NKDA   Social History 1) tobacco use 1/2 ppd, 2) denies EtOH, 3) MJ use prior to pregnancy   Family History Non-Contributory   ROS:  ROS All systems were reviewed.  HEENT, CNS, GI, GU, Respiratory, CV, Renal and Musculoskeletal systems were found to be normal., unless noted in HPI   Exam:  General no apparent distress   Mental Status clear   Chest clear   Heart normal sinus rhythm   Abdomen gravid, tender with contractions   Estimated Fetal Weight 7.5 pounds   Fetal Position cephalic   Back no CVAT   Edema no edema   Pelvic no external lesions, 6cm per RN (changed from 4cm)   Mebranes Intact   FHT normal rate with no decels   FHT Description 140/mod var/+accels/no decels   Ucx irregular   Skin 2 q 10 min   Impression:  Impression 1) Intrauterine pregnancy at 89w5destational age, 2) active labor   Plan:  Comments 1) Labor: expectant management  2) Fetus - category I tracing  3) PNL AB negative / ABSC negative / Rubella equiv - MMR pp / VZI / HIV neg / RPR NR / HBsAg neg / msAFP negative / 1st trimester screen negative / 1-hr OGTT 117 / GBS  negative / total weight gain this pregnancy = 59lb     - s/p rhogam at 07/05/14  4) TDAP given 07/19/14 /fCleopatra Cedargiven 05/12/14  5) history of MJ use - urine drug screen at admission  6) Disposition - home postpartum   Labs:  Lab Results: Routine Hem:  04-Mar-16 12:46   WBC (CBC)  11.6  Hemoglobin (CBC)  10.6  Hematocrit (CBC)  32.8  Platelet Count (CBC) 206   Electronic Signatures: JaWill BonnetMD)  (Signed 04-Mar-16 14:01)  Authored: L&D Evaluation, Labs   Last Updated: 04-Mar-16 14:01 by JaWill BonnetMD)

## 2014-11-22 NOTE — H&P (Signed)
L&D Evaluation:  History Expanded:  HPI 24yo with early iup at approx 5 weeks. she presents today with pain that refers to her left flank, she is coc aine and mj pos today. this  has been going on for 3 days,   Gravida 1   Term 0   PreTerm 0   Abortion 0   Living 0   Group B Strep Results Maternal (Result >5wks must be treated as unknown) unknown/result > 5 weeks ago    Maternal HIV Unknown   Maternal Syphilis Ab Unknown   Maternal Varicella Unknown   Rubella Results (Maternal) unknown   Maternal T-Dap Unknown   [facility]   Presents with abdominal pain, back pain, dysuria   Patient's Medical History No Chronic Illness    Patient's Surgical History none    Medications Pre Natal Vitamins    Allergies NKDA   Social History tobacco  EtOH  drugs    Family History Non-Contributory    Current Prenatal Course Notable For No Prenatal Care    ROS:  General in acute pain uncomfortable   HEENT normal   CNS normal   GI normal   GU pain referring to the back   Resp normal   CV normal   Renal normal   MS normal   Exam:  Vital Signs stable  BP slightly elevated but in pain.    Urine Protein pyelonephritis   General other   Mental Status clear    Chest clear    Heart normal sinus rhythm   Abdomen gravid, non-tender   Back CVAT   Edema no edema    Reflexes 1+    Clonus positive   Pelvic no external lesions   Mebranes Intact   FHT other, too early to detect   Ucx absent   Skin dry   Lymph no lymphadenopathy    Impression:  Impression pyelonephritis   Plan:  Plan UA, abx, po pain meds   Follow Up Appointment need to schedule   Electronic Signatures: Adria DevonKlett, Neamiah Sciarra (MD)  (Signed 24-Oct-14 17:06)  Authored: L&D Evaluation   Last Updated: 24-Oct-14 17:06 by Adria DevonKlett, Natika Geyer (MD)

## 2014-11-22 NOTE — H&P (Signed)
L&D Evaluation:  History:  HPI 24yo G2P1001 at 36w presents for some low pelvic cramping and swelling of her feet.  +FM, no LOF VB   Exam:  Urine Protein UA: LE 3+, nitrite neg, RBC 15, WBC 36, Bacteria 1+, Epithelial cells 19   General no apparent distress   Mental Status clear   Chest clear   Heart normal sinus rhythm   Abdomen gravid, non-tender   Estimated Fetal Weight Average for gestational age   Back no CVAT   Edema 1+   Pelvic cervix closed and thick   Mebranes Intact   FHT normal rate with no decels, 145 mod +accels no decels   Ucx absent   Impression:  Impression UTI, reactive NST   Plan:  Plan discharge   Comments 24yo G2P1001 @ 36w with pelvic pain and possible UTI   1. urine culture pending 2. home with Macrobid 100mg  BID x 7 days - will notify patient to stop if culture returns negative. 3. keep next week appointment   Electronic Signatures: Allyse Fregeau, Elenora Fenderhelsea C (MD)  (Signed 14-Feb-16 12:28)  Authored: L&D Evaluation   Last Updated: 14-Feb-16 12:28 by Chamika Cunanan, Elenora Fenderhelsea C (MD)

## 2014-12-14 ENCOUNTER — Telehealth: Payer: Self-pay

## 2014-12-14 DIAGNOSIS — F411 Generalized anxiety disorder: Secondary | ICD-10-CM

## 2014-12-14 MED ORDER — BUSPIRONE HCL 7.5 MG PO TABS: 7.5000 mg | ORAL_TABLET | Freq: Two times a day (BID) | ORAL | Status: DC

## 2014-12-14 NOTE — Telephone Encounter (Signed)
Patient call clinic indicated she had a rash related to the Vistaril. Patient has requested an alternative. Will contact patient and discuss use of BuSpar in place of the Vistaril. Please contact patient and let her know that BuSpar has been prescribed for her.

## 2014-12-14 NOTE — Telephone Encounter (Signed)
pt had a rash with vistaril pt wants to try something else

## 2014-12-20 NOTE — Telephone Encounter (Signed)
left message that rx was called into [facility].

## 2014-12-23 ENCOUNTER — Ambulatory Visit: Payer: Medicaid Other | Admitting: Licensed Clinical Social Worker

## 2014-12-30 ENCOUNTER — Ambulatory Visit (INDEPENDENT_AMBULATORY_CARE_PROVIDER_SITE_OTHER): Payer: Medicaid Other | Admitting: Psychiatry

## 2014-12-30 ENCOUNTER — Encounter: Payer: Self-pay | Admitting: Psychiatry

## 2014-12-30 VITALS — BP 101/71 | HR 69 | Resp 12 | Ht 63.0 in

## 2014-12-30 DIAGNOSIS — F331 Major depressive disorder, recurrent, moderate: Secondary | ICD-10-CM | POA: Diagnosis not present

## 2014-12-30 DIAGNOSIS — F411 Generalized anxiety disorder: Secondary | ICD-10-CM | POA: Diagnosis not present

## 2014-12-30 MED ORDER — BUSPIRONE HCL 7.5 MG PO TABS: 15.0000 mg | ORAL_TABLET | Freq: Two times a day (BID) | ORAL | Status: DC

## 2014-12-30 MED ORDER — ESCITALOPRAM OXALATE 10 MG PO TABS: 10.0000 mg | ORAL_TABLET | Freq: Every day | ORAL | Status: DC

## 2014-12-30 NOTE — Progress Notes (Signed)
BH MD/PA/NP OP Progress Note  12/30/2014 8:51 AM Chelsea Joseph  MRN:  [id number]  Subjective:  Patient returns for follow-up for major depression and generalized anxiety disorder. She had placed a phone call to this writer on 12/14/2014 stating she had a reaction (rash) to the Vistaril. At that time I ordered BuSpar for her. She's been taking BuSpar 7 a half milligrams twice a day and found that it's been effective for her anxiety. She has continued to take the Lexapro 10 mg daily. We discussed that it is too early to assess the effects of Lexapro. However she denied any side effects from the Lexapro.  That she continues to work. Chief Complaint:  Chief Complaint    Anxiety; Depression     Visit Diagnosis:  No diagnosis found.  Past Medical History: No past medical history on file. No past surgical history on file. Family History: No family history on file. Social History:  History   Social History  . Marital Status: Single    Spouse Name: N/A  . Number of Children: N/A  . Years of Education: N/A   Social History Main Topics  . Smoking status: Current Every Day Smoker -- 1.00 packs/day for 5 years    Types: Cigarettes  . Smokeless tobacco: Not on file  . Alcohol Use: No  . Drug Use: No  . Sexual Activity: Yes    Birth Control/ Protection: None   Other Topics Concern  . None   Social History Narrative   Additional History:   Assessment:   Musculoskeletal: Strength & Muscle Tone: within normal limits Gait & Station: normal Patient leans: Backward and N/A  Psychiatric Specialty Exam: HPI  Review of Systems  Psychiatric/Behavioral: Positive for depression. Negative for suicidal ideas, hallucinations, memory loss and substance abuse. The patient is nervous/anxious and has insomnia.     Blood pressure 101/71, pulse 69, resp. rate 12, height 5\' 3"  (1.6 m).There is no weight on file to calculate BMI.  General Appearance: Neat and Well Groomed  Eye Contact:  Good   Speech:  Clear and Coherent and Normal Rate  Volume:  Normal  Mood:  good  Affect:  Appropriate and Congruent  Thought Process:  Linear and Logical  Orientation:  Full (Time, Place, and Person)  Thought Content:  Negative  Suicidal Thoughts:  No  Homicidal Thoughts:  No  Memory:  Immediate;   Good Recent;   Good Remote;   Good  Judgement:  Good  Insight:  Good  Psychomotor Activity:  Negative  Concentration:  Good  Recall:  Good  Fund of Knowledge: Good  Language: Good  Akathisia:  Negative  Handed:  Right unknown  AIMS (if indicated):  Not done  Assets:  Communication Skills Desire for Improvement  ADL's:  Intact  Cognition: WNL  Sleep:  poor   Is the patient at risk to self?  No. Has the patient been a risk to self in the past 6 months?  No. Has the patient been a risk to self within the distant past?  No. Is the patient a risk to others?  No. Has the patient been a risk to others in the past 6 months?  No. Has the patient been a risk to others within the distant past?  No.  Current Medications: Current Outpatient Prescriptions  Medication Sig Dispense Refill  . escitalopram (LEXAPRO) 10 MG tablet Take 10 mg by mouth daily.    . busPIRone (BUSPAR) 7.5 MG tablet Take 1 tablet (7.5 mg total)  by mouth 2 (two) times daily. 60 tablet 0   No current facility-administered medications for this visit.    Medical Decision Making:  Established Problem, Stable/Improving (1) at this point early in the trial of Lexapro, however tolerating medication without any side effects. Switched her from Vistaril to BuSpar secondary to a rash with the Vistaril.  Treatment Plan Summary:Medication management Continue Lexapro 10 mg daily. We'll increase her BuSpar to 15 mg twice daily and this is up from 7.5 mg twice daily. Patient will follow up in 1 month. She's been encouraged call with any questions or concerns prior to her next point. She is also interested in making an appointment with  the therapist in this clinic.  Wallace Going 12/30/2014, 8:51 AM

## 2015-01-05 ENCOUNTER — Ambulatory Visit: Payer: Self-pay | Admitting: Psychiatry

## 2015-01-06 ENCOUNTER — Other Ambulatory Visit: Payer: Self-pay

## 2015-01-06 ENCOUNTER — Ambulatory Visit: Payer: Medicaid Other | Admitting: Licensed Clinical Social Worker

## 2015-01-06 ENCOUNTER — Ambulatory Visit: Payer: Self-pay | Admitting: Psychiatry

## 2015-01-06 DIAGNOSIS — F331 Major depressive disorder, recurrent, moderate: Secondary | ICD-10-CM | POA: Insufficient documentation

## 2015-01-06 DIAGNOSIS — F411 Generalized anxiety disorder: Secondary | ICD-10-CM | POA: Insufficient documentation

## 2015-01-31 ENCOUNTER — Ambulatory Visit: Payer: Medicaid Other | Admitting: Psychiatry

## 2015-03-01 ENCOUNTER — Emergency Department
Admission: EM | Admit: 2015-03-01 | Discharge: 2015-03-01 | Disposition: A | Discharge status: Home / Self Care | Payer: Medicaid Other | Attending: Student | Admitting: Student

## 2015-03-01 ENCOUNTER — Encounter: Payer: Self-pay | Admitting: Emergency Medicine

## 2015-03-01 DIAGNOSIS — T192XXA Foreign body in vulva and vagina, initial encounter: Secondary | ICD-10-CM | POA: Diagnosis present

## 2015-03-01 DIAGNOSIS — Y9289 Other specified places as the place of occurrence of the external cause: Secondary | ICD-10-CM | POA: Diagnosis not present

## 2015-03-01 DIAGNOSIS — X58XXXA Exposure to other specified factors, initial encounter: Secondary | ICD-10-CM | POA: Insufficient documentation

## 2015-03-01 DIAGNOSIS — Y9389 Activity, other specified: Secondary | ICD-10-CM | POA: Insufficient documentation

## 2015-03-01 DIAGNOSIS — Y998 Other external cause status: Secondary | ICD-10-CM | POA: Diagnosis not present

## 2015-03-01 DIAGNOSIS — Z720 Tobacco use: Secondary | ICD-10-CM | POA: Diagnosis not present

## 2015-03-01 DIAGNOSIS — Z79899 Other long term (current) drug therapy: Secondary | ICD-10-CM | POA: Diagnosis not present

## 2015-03-01 NOTE — Discharge Instructions (Signed)
Vaginal Foreign Body A vaginal foreign body is any object that gets stuck or left inside the vagina. This can cause:  Bleeding.  Itching.  Pain.  Swelling.  Rash. In most cases, symptoms go away once the object is found and taken out. Rarely, an object can break through the walls of the vagina and cause a serious infection. HOME CARE  Take all medicines as told by your doctor.  If you were given an antibiotic medicine, finish it all even if you start to feel better.  Do not have sex or use tampons until your doctor says it is okay.  Do not clean the vagina with a jet of water (douche) unless told by your doctor.  Keep all follow-up visits as told by your doctor. This is important. GET HELP IF:  You have a fever.  You have belly (abdominal) pain.  You have pain when you pee (urinate). GET HELP RIGHT AWAY IF:   You have very bad belly pain.  You have heavy bleeding or fluid coming from the vagina. MAKE SURE YOU:  Understand these instructions.  Will watch your condition.  Will get help right away if you are not doing well or get worse. Document Released: 06/19/2009 Document Revised: 11/15/2013 Document Reviewed: 04/30/2013 [facility] Patient Information 2015 [place], [place]. This information is not intended to replace advice given to you by your health care provider. Make sure you discuss any questions you have with your health care provider.

## 2015-03-01 NOTE — ED Provider Notes (Signed)
[facility] Emergency Department Provider Note ____________________________________________  Time seen: Approximately 11:34 AM  I have reviewed the triage vital signs and the nursing notes.   HISTORY  Chief Complaint Foreign Body in Vagina   HPI Chelsea Joseph is a 24 y.o. female presented to the emergency roomwith complaint of a tampon stuck in her vagina.  She notes she put the tampon in last night around 9 pm and now can't find it.  She is positive it is still inside her.  She notes some pelvic fullness on the left side but no pain.  No burning, discharge, or blood noted.  No fever or chills.  She has an IUD that was placed 4 months ago and she is worried about the tampon messing it up.   History reviewed. No pertinent past medical history.  Patient Active Problem List   Diagnosis Date Noted  . Anxiety, generalized 01/06/2015  . Depression, major, recurrent, moderate 01/06/2015  . UTI (lower urinary tract infection) 12/08/2012  . Bacterial vaginosis 12/08/2012    History reviewed. No pertinent past surgical history.  Current Outpatient Rx  Name  Route  Sig  Dispense  Refill  . busPIRone (BUSPAR) 7.5 MG tablet   Oral   Take 2 tablets (15 mg total) by mouth 2 (two) times daily.   60 tablet   1   . escitalopram (LEXAPRO) 10 MG tablet   Oral   Take 1 tablet (10 mg total) by mouth daily.   30 tablet   1   . hydrOXYzine (VISTARIL) 25 MG capsule   Oral   Take by mouth.           Allergies Vistaril  History reviewed. No pertinent family history.  Social History Social History  Substance Use Topics  . Smoking status: Current Every Day Smoker -- 1.00 packs/day for 5 years    Types: Cigarettes  . Smokeless tobacco: None  . Alcohol Use: No    Review of Systems Constitutional: No fever/chills Eyes: No visual changes. ENT: No sore throat. Cardiovascular: Denies chest pain. Respiratory: Denies shortness of breath. Gastrointestinal:  No abdominal pain.  No nausea, no vomiting.  No diarrhea.  No constipation. Genitourinary: Tampon stuck in vagina.  Pelvic fullness. Musculoskeletal: Negative for back pain. Skin: Negative for rash. Neurological: Negative for headaches, focal weakness or numbness. 10-point ROS otherwise negative.  ____________________________________________   PHYSICAL EXAM:  VITAL SIGNS: ED Triage Vitals  Enc Vitals Group     BP 03/01/15 1110 117/67 mmHg     Pulse Rate 03/01/15 1110 60     Resp 03/01/15 1110 18     Temp 03/01/15 1110 98 F (36.7 C)     Temp src --      SpO2 03/01/15 1110 98 %     Weight 03/01/15 1110 165 lb (74.844 kg)     Height 03/01/15 1110 5\' 5"  (1.651 m)     Head Cir --      Peak Flow --      Pain Score --      Pain Loc --      Pain Edu? --      Excl. in GC? --     Constitutional: Alert and oriented. Well appearing and in no acute distress. Eyes: Conjunctivae are normal. PERRL. EOMI. Head: Atraumatic. Nose: No congestion/rhinnorhea. Mouth/Throat: Mucous membranes are moist.  Oropharynx non-erythematous. Neck: No stridor.   Cardiovascular: Normal rate, regular rhythm. Grossly normal heart sounds.  Good peripheral circulation. Respiratory: Normal respiratory  effort.  No retractions. Lungs CTAB. Gastrointestinal: Soft and nontender. No distention. No abdominal bruits. No CVA tenderness. Genitourinary: Tampon found with speculum exam.  No bleeding, discharge, or irritation of the vaginal vault noted. Musculoskeletal: No lower extremity tenderness nor edema.  No joint effusions. Neurologic:  Normal speech and language. No gross focal neurologic deficits are appreciated. No gait instability. Skin:  Skin is warm, dry and intact. No rash noted. Psychiatric: Mood and affect are normal. Speech and behavior are normal.  ____________________________________________   LABS (all labs ordered are listed, but only abnormal results are displayed)  Labs Reviewed - No data to  display ____________________________________________  EKG  None ____________________________________________  RADIOLOGY  None ____________________________________________   PROCEDURES  Procedure(s) performed: Speculum Exam, see procedure note(s).   Unable to locate tampon with manual exam, therefore speculum exam was performed. Speculum Exam performed.  Removal of tampon with forceps once found on exam. Patient tolerated exam and removal without complaint.  Critical Care performed: No  ____________________________________________   INITIAL IMPRESSION / ASSESSMENT AND PLAN / ED COURSE  Pertinent labs & imaging results that were available during my care of the patient were reviewed by me and considered in my medical decision making (see chart for details).  Patient presented to the emergency room for removal of tampon from vaginal canal.  Speculum exam completed with removal of tampon.  Follow up with OB/GYN at [facility] for any further complaints. ____________________________________________   FINAL CLINICAL IMPRESSION(S) / ED DIAGNOSES  Final diagnoses:  Vaginal foreign body, initial encounter     Joni Reining, PA-C 03/01/15 1148  Gayla Doss, MD 03/01/15 1402

## 2015-03-01 NOTE — ED Notes (Signed)
Patient to ED with report of tampon stuck in vagina.

## 2015-04-06 ENCOUNTER — Ambulatory Visit (INDEPENDENT_AMBULATORY_CARE_PROVIDER_SITE_OTHER): Payer: Medicaid Other | Admitting: Psychiatry

## 2015-04-06 ENCOUNTER — Encounter: Payer: Self-pay | Admitting: Psychiatry

## 2015-04-06 VITALS — BP 122/60 | HR 71 | Temp 98.2°F | Ht 65.0 in | Wt 174.6 lb

## 2015-04-06 DIAGNOSIS — F411 Generalized anxiety disorder: Secondary | ICD-10-CM

## 2015-04-06 MED ORDER — ALPRAZOLAM 0.5 MG PO TABS: 0.5000 mg | ORAL_TABLET | Freq: Two times a day (BID) | ORAL | Status: DC | PRN

## 2015-04-06 MED ORDER — BUSPIRONE HCL 15 MG PO TABS: 15.0000 mg | ORAL_TABLET | Freq: Two times a day (BID) | ORAL | Status: DC

## 2015-04-06 NOTE — Progress Notes (Signed)
BH MD/PA/NP OP Progress Note  04/06/2015 2:49 PM Chelsea Joseph  MRN:  [id number]  Subjective:  Patient returns for follow-up for major depression and generalized anxiety disorder. She states overall the BuSpar has been helpful to her. It appears that the largest stressors related to her anxiety and mood are her work demands. She indicates she works 2 jobs. She works jobs as a Production assistant, radio at [employer] and also works at [employer]. She states she does have the weekends off. She is caring for her baby. She states in regards to social supports she does not have many as the father's to her children are in jail. She states at times she becomes overwhelmed with things, feels anxious, stressed. She does notice that with the BuSpar sometimes it causes her to sweat.  Patient states that she has low energy but denies any depressed mood. She states that she is typically able to fall asleep when she does have time to sleep. Chief Complaint: Anxiety Chief Complaint    Follow-up; Medication Refill; Anxiety     Visit Diagnosis:     ICD-9-CM ICD-10-CM   1. Anxiety state 300.00 F41.1 busPIRone (BUSPAR) 15 MG tablet    Past Medical History:  Past Medical History  Diagnosis Date  . Anxiety   . Depression    History reviewed. No pertinent past surgical history. Family History:  Family History  Problem Relation Age of Onset  . Diabetes Mother   . Heart attack Mother   . Hypertension Mother   . Alcohol abuse Father   . Anxiety disorder Sister   . Panic disorder Sister    Social History:  Social History   Social History  . Marital Status: Single    Spouse Name: N/A  . Number of Children: N/A  . Years of Education: N/A   Social History Main Topics  . Smoking status: Current Every Day Smoker -- 1.00 packs/day for 5 years    Types: Cigarettes    Start date: 04/05/2005  . Smokeless tobacco: Never Used  . Alcohol Use: 2.4 oz/week    0 Standard drinks or equivalent, 1 Glasses of wine, 0 Cans of  beer, 3 Shots of liquor per week  . Drug Use: No  . Sexual Activity: Yes    Birth Control/ Protection: Condom, IUD   Other Topics Concern  . None   Social History Narrative   Additional History:   Assessment:   Musculoskeletal: Strength & Muscle Tone: within normal limits Gait & Station: normal Patient leans: Backward and N/A  Psychiatric Specialty Exam: Anxiety Symptoms include nervous/anxious behavior. Patient reports no insomnia or suicidal ideas.      Review of Systems  Psychiatric/Behavioral: Negative for depression, suicidal ideas, hallucinations, memory loss and substance abuse. The patient is nervous/anxious. The patient does not have insomnia.     Blood pressure 122/60, pulse 71, temperature 98.2 F (36.8 C), temperature source Tympanic, height  (1.651 m), weight 174 lb 9.6 oz (79.198 kg), SpO2 96 %.Body mass index is 29.05 kg/(m^2).  General Appearance: Neat and Well Groomed  Eye Contact:  Good  Speech:  Clear and Coherent and Normal Rate  Volume:  Normal  Mood:  good  Affect:  Appropriate and Congruent  Thought Process:  Linear and Logical  Orientation:  Full (Time, Place, and Person)  Thought Content:  Negative  Suicidal Thoughts:  No  Homicidal Thoughts:  No  Memory:  Immediate;   Good Recent;   Good Remote;   Good  Judgement:  Good  Insight:  Good  Psychomotor Activity:  Negative  Concentration:  Good  Recall:  Good  Fund of Knowledge: Good  Language: Good  Akathisia:  Negative  Handed:  Right unknown  AIMS (if indicated):  Not done  Assets:  Communication Skills Desire for Improvement  ADL's:  Intact  Cognition: WNL  Sleep:  poor   Is the patient at risk to self?  No. Has the patient been a risk to self in the past 6 months?  No. Has the patient been a risk to self within the distant past?  No. Is the patient a risk to others?  No. Has the patient been a risk to others in the past 6 months?  No. Has the patient been a risk to others  within the distant past?  No.  Current Medications: Current Outpatient Prescriptions  Medication Sig Dispense Refill  . busPIRone (BUSPAR) 15 MG tablet Take 1 tablet (15 mg total) by mouth 2 (two) times daily. 60 tablet 1  . ALPRAZolam (XANAX) 0.5 MG tablet Take 1 tablet (0.5 mg total) by mouth 2 (two) times daily as needed for anxiety. 60 tablet 1   No current facility-administered medications for this visit.    Medical Decision Making:  Established Problem, Stable/Improving (1) at this point early in the trial of Lexapro, however tolerating medication without any side effects. Switched her from Vistaril to BuSpar secondary to a rash with the Vistaril.  Treatment Plan Summary:Medication management Patient is a longer taking the Lexapro as she feels that depression has not been  the issue for her.. She reports that she really does not feel her issues are depression. She states it's more anxiety surrounding her finances, hectic work schedule and caring for her baby.   Generalized anxiety disorder Continue  BuSpar to 15 mg twice daily. Provide alprazolam 0.5 mg twice daily as needed for anxiety. Risk and benefits of been discussed patient able consent. Patient will follow up in 1 month. She's been encouraged call with any questions or concerns prior to her next point. Again recommended that she engage in therapy given that she has a very stressful life. However she states that with her work schedule it would be difficult to read really make any therapy appointments.    Wallace Going 04/06/2015, 2:49 PM

## 2015-05-03 ENCOUNTER — Ambulatory Visit (INDEPENDENT_AMBULATORY_CARE_PROVIDER_SITE_OTHER): Payer: Medicaid Other | Admitting: Psychiatry

## 2015-05-03 ENCOUNTER — Encounter: Payer: Self-pay | Admitting: Psychiatry

## 2015-05-03 VITALS — BP 110/72 | HR 91 | Temp 98.1°F | Ht 65.0 in | Wt 172.6 lb

## 2015-05-03 DIAGNOSIS — F411 Generalized anxiety disorder: Secondary | ICD-10-CM | POA: Diagnosis not present

## 2015-05-03 MED ORDER — BUSPIRONE HCL 15 MG PO TABS: 15.0000 mg | ORAL_TABLET | Freq: Two times a day (BID) | ORAL | Status: DC

## 2015-05-03 MED ORDER — ALPRAZOLAM 0.5 MG PO TABS: 0.5000 mg | ORAL_TABLET | Freq: Two times a day (BID) | ORAL | Status: DC | PRN

## 2015-05-03 NOTE — Progress Notes (Signed)
BH MD/PA/NP OP Progress Note  05/03/2015 3:00 PM Chelsea StampsDeanna Joseph  MRN:  [id number]  Subjective:  Patient returns for follow-up for major depression and generalized anxiety disorder. She feels like her medications have been working. She states that she continues to have significant life stressors of being a single mom and working multiple jobs. However she states that the medication has assisted her and she feels like she is less stress on them. She states she not use the alprazolam daily. She denies any side effects from it or the buspirone.  She indicates another stressor is that she's had to pull her children out of the daycare as there were some issues with the quality of care at that location. She states she now has a friend watching her children.  She states her sleep is good but she does wake up for her baby. She states that her appetite is good. She states she does not engage in anything or do enjoyable things but cites that that is due to her work schedule. She states that if she did not have to work she believes she could get out and enjoy things. Chief Complaint: good Chief Complaint    Follow-up; Medication Refill     Visit Diagnosis:     ICD-9-CM ICD-10-CM   1. Anxiety state 300.00 F41.1 busPIRone (BUSPAR) 15 MG tablet    Past Medical History:  Past Medical History  Diagnosis Date  . Anxiety   . Depression    History reviewed. No pertinent past surgical history. Family History:  Family History  Problem Relation Age of Onset  . Diabetes Mother   . Heart attack Mother   . Hypertension Mother   . Alcohol abuse Father   . Anxiety disorder Sister   . Panic disorder Sister    Social History:  Social History   Social History  . Marital Status: Single    Spouse Name: N/A  . Number of Children: N/A  . Years of Education: N/A   Social History Main Topics  . Smoking status: Current Every Day Smoker -- 1.00 packs/day for 5 years    Types: Cigarettes    Start date:  04/05/2005  . Smokeless tobacco: Never Used  . Alcohol Use: 0.0 - 2.4 oz/week    0 Cans of beer, 0 Standard drinks or equivalent, 0-1 Glasses of wine, 0-3 Shots of liquor per week  . Drug Use: No  . Sexual Activity: Yes    Birth Control/ Protection: Condom, IUD   Other Topics Concern  . None   Social History Narrative   Additional History:   Assessment:   Musculoskeletal: Strength & Muscle Tone: within normal limits Gait & Station: normal Patient leans: Backward and N/A  Psychiatric Specialty Exam: Anxiety Symptoms include nervous/anxious behavior (He reports this is responding well to her current medications.). Patient reports no insomnia or suicidal ideas.      Review of Systems  Psychiatric/Behavioral: Negative for depression, suicidal ideas, hallucinations, memory loss and substance abuse. The patient is nervous/anxious (He reports this is responding well to her current medications.). The patient does not have insomnia.   All other systems reviewed and are negative.   Blood pressure 110/72, pulse 91, temperature 98.1 F (36.7 C), temperature source Tympanic, height 5\' 5"  (1.651 m), weight 172 lb 9.6 oz (78.291 kg), SpO2 96 %.Body mass index is 28.72 kg/(m^2).  General Appearance: Neat and Well Groomed  Eye Contact:  Good  Speech:  Clear and Coherent and Normal Rate  Volume:  Normal  Mood:  good  Affect:  Appropriate and Congruent  Thought Process:  Linear and Logical  Orientation:  Full (Time, Place, and Person)  Thought Content:  Negative  Suicidal Thoughts:  No  Homicidal Thoughts:  No  Memory:  Immediate;   Good Recent;   Good Remote;   Good  Judgement:  Good  Insight:  Good  Psychomotor Activity:  Negative  Concentration:  Good  Recall:  Good  Fund of Knowledge: Good  Language: Good  Akathisia:  Negative  Handed:  Right unknown  AIMS (if indicated):  Not done  Assets:  Communication Skills Desire for Improvement  ADL's:  Intact  Cognition: WNL   Sleep:  poor   Is the patient at risk to self?  No. Has the patient been a risk to self in the past 6 months?  No. Has the patient been a risk to self within the distant past?  No. Is the patient a risk to others?  No. Has the patient been a risk to others in the past 6 months?  No. Has the patient been a risk to others within the distant past?  No.  Current Medications: Current Outpatient Prescriptions  Medication Sig Dispense Refill  . ALPRAZolam (XANAX) 0.5 MG tablet Take 1 tablet (0.5 mg total) by mouth 2 (two) times daily as needed for anxiety. 60 tablet 1  . busPIRone (BUSPAR) 15 MG tablet Take 1 tablet (15 mg total) by mouth 2 (two) times daily. 60 tablet 2   No current facility-administered medications for this visit.    Medical Decision Making:  Established Problem, Stable/Improving (1) at this point early in the trial of Lexapro, however tolerating medication without any side effects. Switched her from Vistaril to BuSpar secondary to a rash with the Vistaril.  Treatment Plan Summary:Medication management.   Generalized anxiety disorder Continue  BuSpar to 15 mg twice daily. Continue alprazolam 0.5 mg twice daily as needed for anxiety.  Patient will follow up in 3 months. She's been encouraged colony questions concerns prior to her next point.   Wallace Going 05/03/2015, 3:00 PM

## 2015-07-29 ENCOUNTER — Encounter: Payer: Self-pay | Admitting: Emergency Medicine

## 2015-07-29 ENCOUNTER — Emergency Department
Admission: EM | Admit: 2015-07-29 | Discharge: 2015-07-29 | Disposition: A | Discharge status: Home / Self Care | Payer: Medicaid Other | Attending: Emergency Medicine | Admitting: Emergency Medicine

## 2015-07-29 DIAGNOSIS — L509 Urticaria, unspecified: Secondary | ICD-10-CM | POA: Diagnosis not present

## 2015-07-29 DIAGNOSIS — F17210 Nicotine dependence, cigarettes, uncomplicated: Secondary | ICD-10-CM | POA: Insufficient documentation

## 2015-07-29 DIAGNOSIS — R21 Rash and other nonspecific skin eruption: Secondary | ICD-10-CM | POA: Diagnosis present

## 2015-07-29 DIAGNOSIS — Z79899 Other long term (current) drug therapy: Secondary | ICD-10-CM | POA: Diagnosis not present

## 2015-07-29 MED ORDER — PREDNISONE 20 MG PO TABS: 60.0000 mg | ORAL_TABLET | Freq: Every day | ORAL | Status: DC

## 2015-07-29 MED ORDER — PREDNISONE 20 MG PO TABS
60.0000 mg | ORAL_TABLET | Freq: Once | ORAL | Status: AC
  Administered 2015-07-29: 60 mg via ORAL
  Filled 2015-07-29: qty 3

## 2015-07-29 MED ORDER — DIPHENHYDRAMINE HCL 25 MG PO CAPS
25.0000 mg | ORAL_CAPSULE | Freq: Once | ORAL | Status: AC
  Administered 2015-07-29: 25 mg via ORAL

## 2015-07-29 MED ORDER — DIPHENHYDRAMINE HCL 25 MG PO CAPS
ORAL_CAPSULE | ORAL | Status: AC
  Filled 2015-07-29: qty 1

## 2015-07-29 MED ORDER — DIPHENHYDRAMINE HCL 25 MG PO CAPS: 25.0000 mg | ORAL_CAPSULE | ORAL | Status: DC | PRN

## 2015-07-29 NOTE — ED Notes (Signed)
Started breaking out in hives tonight at around 2100 while at work.    Itchy, red rash seen to arms and trunk.  Lungs CTA. NAD

## 2015-07-29 NOTE — Discharge Instructions (Signed)
Allergies °An allergy is an abnormal reaction to a substance by the body's defense system (immune system). Allergies can develop at any age. °WHAT CAUSES ALLERGIES? °An allergic reaction happens when the immune system mistakenly reacts to a normally harmless substance, called an allergen, as if it were harmful. The immune system releases antibodies to fight the substance. Antibodies eventually release a chemical called histamine into the bloodstream. The release of histamine is meant to protect the body from infection, but it also causes discomfort. °An allergic reaction can be triggered by: °· Eating an allergen. °· Inhaling an allergen. °· Touching an allergen. °WHAT TYPES OF ALLERGIES ARE THERE? °There are many types of allergies. Common types include: °· Seasonal allergies. People with this type of allergy are usually allergic to substances that are only present during certain seasons, such as molds and pollens. °· Food allergies. °· Drug allergies. °· Insect allergies. °· Animal dander allergies. °WHAT ARE SYMPTOMS OF ALLERGIES? °Possible allergy symptoms include: °· Swelling of the lips, face, tongue, mouth, or throat. °· Sneezing, coughing, or wheezing. °· Nasal congestion. °· Tingling in the mouth. °· Rash. °· Itching. °· Itchy, red, swollen areas of skin (hives). °· Watery eyes. °· Vomiting. °· Diarrhea. °· Dizziness. °· Lightheadedness. °· Fainting. °· Trouble breathing or swallowing. °· Chest tightness. °· Rapid heartbeat. °HOW ARE ALLERGIES DIAGNOSED? °Allergies are diagnosed with a medical and family history and one or more of the following: °· Skin tests. °· Blood tests. °· A food diary. A food diary is a record of all the foods and drinks you have in a day and of all the symptoms you experience. °· The results of an elimination diet. An elimination diet involves eliminating foods from your diet and then adding them back in one by one to find out if a certain food causes an allergic reaction. °HOW ARE  ALLERGIES TREATED? °There is no cure for allergies, but allergic reactions can be treated with medicine. Severe reactions usually need to be treated at a hospital. °HOW CAN REACTIONS BE PREVENTED? °The best way to prevent an allergic reaction is by avoiding the substance you are allergic to. Allergy shots and medicines can also help prevent reactions in some cases. People with severe allergic reactions may be able to prevent a life-threatening reaction called anaphylaxis with a medicine given right after exposure to the allergen. °  °This information is not intended to replace advice given to you by your health care provider. Make sure you discuss any questions you have with your health care provider. °  °Document Released: 09/24/2002 Document Revised: 07/22/2014 Document Reviewed: 04/12/2014 °Elsevier Interactive Patient Education ©2016 [employer] ° °Hives °Hives are itchy, red, swollen areas of the skin. They can vary in size and location on your body. Hives can come and go for hours or several days (acute hives) or for several weeks (chronic hives). Hives do not spread from person to person (noncontagious). They may get worse with scratching, exercise, and emotional stress. °CAUSES  °· Allergic reaction to food, additives, or drugs. °· Infections, including the common cold. °· Illness, such as vasculitis, lupus, or thyroid disease. °· Exposure to sunlight, heat, or cold. °· Exercise. °· Stress. °· Contact with chemicals. °SYMPTOMS  °· Red or white swollen patches on the skin. The patches may change size, shape, and location quickly and repeatedly. °· Itching. °· Swelling of the hands, feet, and face. This may occur if hives develop deeper in the skin. °DIAGNOSIS  °Your caregiver can usually tell   what is wrong by performing a physical exam. Skin or blood tests may also be done to determine the cause of your hives. In some cases, the cause cannot be determined. °TREATMENT  °Mild cases usually get better with  medicines such as antihistamines. Severe cases may require an emergency epinephrine injection. If the cause of your hives is known, treatment includes avoiding that trigger.  °HOME CARE INSTRUCTIONS  °· Avoid causes that trigger your hives. °· Take antihistamines as directed by your caregiver to reduce the severity of your hives. Non-sedating or low-sedating antihistamines are usually recommended. Do not drive while taking an antihistamine. °· Take any other medicines prescribed for itching as directed by your caregiver. °· Wear loose-fitting clothing. °· Keep all follow-up appointments as directed by your caregiver. °SEEK MEDICAL CARE IF:  °· You have persistent or severe itching that is not relieved with medicine. °· You have painful or swollen joints. °SEEK IMMEDIATE MEDICAL CARE IF:  °· You have a fever. °· Your tongue or lips are swollen. °· You have trouble breathing or swallowing. °· You feel tightness in the throat or chest. °· You have abdominal pain. °These problems may be the first sign of a life-threatening allergic reaction. Call your local emergency services (911 in [place] °MAKE SURE YOU:  °· Understand these instructions. °· Will watch your condition. °· Will get help right away if you are not doing well or get worse. °  °This information is not intended to replace advice given to you by your health care provider. Make sure you discuss any questions you have with your health care provider. °  °Document Released: 07/01/2005 Document Revised: 07/06/2013 Document Reviewed: 09/24/2011 °Elsevier Interactive Patient Education ©2016 [employer] Inc. ° °

## 2015-07-29 NOTE — ED Provider Notes (Signed)
[facility] Emergency Department Provider Note  ____________________________________________   I have reviewed the triage vital signs and the nursing notes.   HISTORY  Chief Complaint Rash    HPI Volanda Yono is a 25 y.o. female who is healthy, states that she had hives starting approximately 8 hours ago. Mostly on her torso and arms. Denies any tongue swelling or anaphylactic symptoms. No angioedema. No throat swelling no wheeze. Has never had this before. No history of anaphylaxis. She received Benadryl in the waiting room and her hives are much better now.  Past Medical History  Diagnosis Date  . Anxiety   . Depression     Patient Active Problem List   Diagnosis Date Noted  . Anxiety, generalized 01/06/2015  . Depression, major, recurrent, moderate (HCC) 01/06/2015  . UTI (lower urinary tract infection) 12/08/2012  . Bacterial vaginosis 12/08/2012    History reviewed. No pertinent past surgical history.  Current Outpatient Rx  Name  Route  Sig  Dispense  Refill  . ALPRAZolam (XANAX) 0.5 MG tablet   Oral   Take 1 tablet (0.5 mg total) by mouth 2 (two) times daily as needed for anxiety.   60 tablet   1     TO BE FILLED November 16, ,2016.   . busPIRone (BUSPAR) 15 MG tablet   Oral   Take 1 tablet (15 mg total) by mouth 2 (two) times daily.   60 tablet   2     Allergies Vistaril  Family History  Problem Relation Age of Onset  . Diabetes Mother   . Heart attack Mother   . Hypertension Mother   . Alcohol abuse Father   . Anxiety disorder Sister   . Panic disorder Sister     Social History Social History  Substance Use Topics  . Smoking status: Current Every Day Smoker -- 1.00 packs/day for 5 years    Types: Cigarettes    Start date: 04/05/2005  . Smokeless tobacco: Never Used  . Alcohol Use: 0.0 - 2.4 oz/week    0 Cans of beer, 0 Standard drinks or equivalent, 0-1 Glasses of wine, 0-3 Shots of liquor per week     Review of Systems Constitutional: No fever/chills Eyes: No visual changes. ENT: No sore throat. No stiff neck no neck pain Cardiovascular: Denies chest pain. Respiratory: Denies shortness of breath. Gastrointestinal:   no vomiting.  No diarrhea.  No constipation. Genitourinary: Negative for dysuria. Musculoskeletal: Negative lower extremity swelling Skin: Positive for hives Neurological: Negative for headaches, focal weakness or numbness. 10-point ROS otherwise negative.  ____________________________________________   PHYSICAL EXAM:  VITAL SIGNS: ED Triage Vitals  Enc Vitals Group     BP 07/29/15 0249 127/98 mmHg     Pulse Rate 07/29/15 0249 98     Resp 07/29/15 0249 18     Temp 07/29/15 0249 97.8 F (36.6 C)     Temp src --      SpO2 07/29/15 0249 98 %     Weight 07/29/15 0249 158 lb (71.668 kg)     Height 07/29/15 0249 5\' 5"  (1.651 m)     Head Cir --      Peak Flow --      Pain Score 07/29/15 0250 0     Pain Loc --      Pain Edu? --      Excl. in GC? --     Constitutional: Alert and oriented. Well appearing and in no acute distress. Eyes: Conjunctivae are  normal. PERRL. EOMI. Head: Atraumatic. Nose: No congestion/rhinnorhea. Mouth/Throat: Mucous membranes are moist.  Oropharynx non-erythematous. No stridor, no hoarse voice, no angioedema oropharynx is normal Neck: No stridor.   Nontender with no meningismus Cardiovascular: Normal rate, regular rhythm. Grossly normal heart sounds.  Good peripheral circulation. Respiratory: Normal respiratory effort.  No retractions. Lungs CTAB. Abdominal: Soft and nontender. No distention. No guarding no rebound Back:  There is no focal tenderness or step off there is no midline tenderness there are no lesions noted. there is no CVA tenderness Musculoskeletal: No lower extremity tenderness. No joint effusions, no DVT signs strong distal pulses no edema Neurologic:  Normal speech and language. No gross focal neurologic deficits  are appreciated.  Skin:  Skin is warm, dry and intact. There are resolving hives noted in extremities and back and torso Psychiatric: Mood and affect are normal. Speech and behavior are normal.  ____________________________________________   LABS (all labs ordered are listed, but only abnormal results are displayed)  Labs Reviewed - No data to display ____________________________________________  EKG  I personally interpreted any EKGs ordered by me or triage  ____________________________________________  RADIOLOGY  I reviewed any imaging ordered by me or triage that were performed during my shift ____________________________________________   PROCEDURES  Procedure(s) performed: None  Critical Care performed: None  ____________________________________________   INITIAL IMPRESSION / ASSESSMENT AND PLAN / ED COURSE  Pertinent labs & imaging results that were available during my care of the patient were reviewed by me and considered in my medical decision making (see chart for details).  He said hives over 8 hours nearly gone with Benadryl no itching at this time no evidence of progression to anaphylaxis. No indication for EpiPen or prolonged observation in this context given 8 hours of symptoms without advancement. Patient will be given steroids here as a precaution however and we will have her follow up closely with primary care/to allergy for further evaluation. No new medications, there is nothing that we can attribute this rash to directly. Patient had no new medications or other ingestions or exposures that she can think of. Return precautions and follow-up given and understood. ____________________________________________   FINAL CLINICAL IMPRESSION(S) / ED DIAGNOSES  Final diagnoses:  None     Jeanmarie PlantJames A Aarya Quebedeaux, MD 07/29/15 [phone]

## 2015-08-03 ENCOUNTER — Ambulatory Visit: Payer: Medicaid Other | Admitting: Psychiatry

## 2015-08-04 ENCOUNTER — Encounter: Payer: Self-pay | Admitting: Psychiatry

## 2015-08-04 ENCOUNTER — Ambulatory Visit (INDEPENDENT_AMBULATORY_CARE_PROVIDER_SITE_OTHER): Payer: Medicaid Other | Admitting: Psychiatry

## 2015-08-04 VITALS — BP 110/62 | HR 94 | Temp 97.0°F | Ht 65.0 in | Wt 166.0 lb

## 2015-08-04 DIAGNOSIS — F411 Generalized anxiety disorder: Secondary | ICD-10-CM

## 2015-08-04 MED ORDER — BUSPIRONE HCL 15 MG PO TABS: 15.0000 mg | ORAL_TABLET | Freq: Two times a day (BID) | ORAL | Status: DC

## 2015-08-04 MED ORDER — ALPRAZOLAM 0.5 MG PO TABS: 0.5000 mg | ORAL_TABLET | Freq: Two times a day (BID) | ORAL | Status: DC | PRN

## 2015-08-04 NOTE — Progress Notes (Signed)
BH MD/PA/NP OP Progress Note  08/04/2015 10:06 AM Chelsea Joseph  MRN:  [id number]  Subjective:  Patient returns for follow-up for  generalized anxiety disorder. She continues to take her BuSpar and alprazolam. However she states that the BuSpar she takes it and then before a few hours initially taking it she sweats however she reports she does feel it helps her anxiety. She states that because of this BuSpar she'll take it in the morning but she will take it immediately before she goes to work because she does not want to be on the job with this particular issue. She states that she might take one whole tablet and then half a tablet as her second dose. Should she continue sick alprazolam twice daily.   She is now working one job as opposed to multiple jobs because the one job at [employer] does pay enough that she does not need multiple jobs that she's had in the past. She continues to raise several children on her own. Chief Complaint: good Chief Complaint    Follow-up; Medication Refill; Anxiety     Visit Diagnosis:     ICD-9-CM ICD-10-CM   1. Anxiety state 300.00 F41.1 busPIRone (BUSPAR) 15 MG tablet    Past Medical History:  Past Medical History  Diagnosis Date  . Anxiety   . Depression    History reviewed. No pertinent past surgical history. Family History:  Family History  Problem Relation Age of Onset  . Diabetes Mother   . Heart attack Mother   . Hypertension Mother   . Alcohol abuse Father   . Anxiety disorder Sister   . Panic disorder Sister    Social History:  Social History   Social History  . Marital Status: Single    Spouse Name: N/A  . Number of Children: N/A  . Years of Education: N/A   Social History Main Topics  . Smoking status: Current Every Day Smoker -- 1.00 packs/day for 5 years    Types: Cigarettes    Start date: 04/05/2005  . Smokeless tobacco: Never Used  . Alcohol Use: 0.0 - 2.4 oz/week    0 Cans of beer, 0 Standard drinks or  equivalent, 0-1 Glasses of wine, 0-3 Shots of liquor per week  . Drug Use: No  . Sexual Activity: Yes    Birth Control/ Protection: Condom, IUD   Other Topics Concern  . None   Social History Narrative   Additional History:   Assessment:   Musculoskeletal: Strength & Muscle Tone: within normal limits Gait & Station: normal Patient leans: Backward and N/A  Psychiatric Specialty Exam: Anxiety Symptoms include nervous/anxious behavior (He reports this is responding well to her current medications.). Patient reports no insomnia or suicidal ideas.      Review of Systems  Psychiatric/Behavioral: Negative for depression, suicidal ideas, hallucinations, memory loss and substance abuse. The patient is nervous/anxious (He reports this is responding well to her current medications.). The patient does not have insomnia.   All other systems reviewed and are negative.   Blood pressure 110/62, pulse 94, temperature 97 F (36.1 C), temperature source Tympanic, height  (1.651 m), weight 166 lb (75.297 kg), SpO2 97 %.Body mass index is 27.62 kg/(m^2).  General Appearance: Neat and Well Groomed  Eye Contact:  Good  Speech:  Clear and Coherent and Normal Rate  Volume:  Normal  Mood:  good  Affect:  Appropriate and Congruent  Thought Process:  Linear and Logical  Orientation:  Full (Time, Place,  and Person)  Thought Content:  Negative  Suicidal Thoughts:  No  Homicidal Thoughts:  No  Memory:  Immediate;   Good Recent;   Good Remote;   Good  Judgement:  Good  Insight:  Good  Psychomotor Activity:  Negative  Concentration:  Good  Recall:  Good  Fund of Knowledge: Good  Language: Good  Akathisia:  Negative  Handed:  Right unknown  AIMS (if indicated):  Not done  Assets:  Communication Skills Desire for Improvement  ADL's:  Intact  Cognition: WNL  Sleep:  poor   Is the patient at risk to self?  No. Has the patient been a risk to self in the past 6 months?  No. Has the  patient been a risk to self within the distant past?  No. Is the patient a risk to others?  No. Has the patient been a risk to others in the past 6 months?  No. Has the patient been a risk to others within the distant past?  No.  Current Medications: Current Outpatient Prescriptions  Medication Sig Dispense Refill  . ALPRAZolam (XANAX) 0.5 MG tablet Take 1 tablet (0.5 mg total) by mouth 2 (two) times daily as needed for anxiety. 60 tablet 4  . busPIRone (BUSPAR) 15 MG tablet Take 1 tablet (15 mg total) by mouth 2 (two) times daily. 60 tablet 4  . diphenhydrAMINE (BENADRYL) 25 mg capsule Take 1 capsule (25 mg total) by mouth every 4 (four) hours as needed. (Patient not taking: Reported on 08/04/2015) 30 capsule 2  . predniSONE (DELTASONE) 20 MG tablet Take 3 tablets (60 mg total) by mouth daily. (Patient not taking: Reported on 08/04/2015) 12 tablet 0   No current facility-administered medications for this visit.    Medical Decision Making:  Established Problem, Stable/Improving (1) at this point early in the trial of Lexapro, however tolerating medication without any side effects. Switched her from Vistaril to BuSpar secondary to a rash with the Vistaril.  Treatment Plan Summary:Medication management.   Generalized anxiety disorder Continue  BuSpar to 15 mg twice daily. Continue alprazolam 0.5 mg twice daily as needed for anxiety.  Patient will follow up in 3 months. I have informed her I will be departing the practice next month but that she will be able to continue with another provider within this clinic. She's been encouraged call with questions concerns prior to her next appointment.   Wallace Going 08/04/2015, 10:06 AM

## 2016-03-14 ENCOUNTER — Encounter: Payer: Self-pay | Admitting: Psychiatry

## 2016-03-14 ENCOUNTER — Ambulatory Visit (INDEPENDENT_AMBULATORY_CARE_PROVIDER_SITE_OTHER): Payer: Medicaid Other | Admitting: Psychiatry

## 2016-03-14 VITALS — BP 114/65 | HR 78 | Ht 64.0 in | Wt 148.6 lb

## 2016-03-14 DIAGNOSIS — F331 Major depressive disorder, recurrent, moderate: Secondary | ICD-10-CM | POA: Diagnosis not present

## 2016-03-14 DIAGNOSIS — F411 Generalized anxiety disorder: Secondary | ICD-10-CM

## 2016-03-14 MED ORDER — BUSPIRONE HCL 15 MG PO TABS: 15.0000 mg | ORAL_TABLET | Freq: Two times a day (BID) | ORAL | 4 refills | Status: DC

## 2016-03-14 NOTE — Progress Notes (Signed)
BH MD/PA/NP OP Progress Note  03/14/2016 2:36 PM Chelsea StampsDeanna Joseph  MRN:  [id number]  Subjective:  Patient returns for follow-up for  generalized anxiety disorder. This is the first visit for this clinician with this patient. When asked how she is doing patient states," how do you think I'm doing." She is uncooperative and rude with this clinician. She refuses to answer any questions. She then states that she wants her prescriptions filled. Discussed with her that that this clinician does not prescribe Xanax and since she has not been seen here for 8 months we can look at other medication regimens. Patient states that she is not interested and would like to see a different doctor and leaves.    Chief Complaint: good Chief Complaint    Follow-up; Medication Refill     Visit Diagnosis:     ICD-9-CM ICD-10-CM   1. Major depressive disorder, recurrent episode, moderate (HCC) 296.32 F33.1   2. Anxiety state 300.00 F41.1 busPIRone (BUSPAR) 15 MG tablet    Past Medical History:  Past Medical History:  Diagnosis Date  . Anxiety   . Depression    History reviewed. No pertinent surgical history. Family History:  Family History  Problem Relation Age of Onset  . Diabetes Mother   . Heart attack Mother   . Hypertension Mother   . Alcohol abuse Father   . Anxiety disorder Sister   . Panic disorder Sister    Social History:  Social History   Social History  . Marital status: Single    Spouse name: N/A  . Number of children: N/A  . Years of education: N/A   Social History Main Topics  . Smoking status: Current Every Day Smoker    Packs/day: 1.00    Years: 5.00    Types: Cigarettes    Start date: 04/05/2005  . Smokeless tobacco: Never Used  . Alcohol use 0.0 - 2.4 oz/week  . Drug use: No  . Sexual activity: Yes    Birth control/ protection: Condom, IUD   Other Topics Concern  . None   Social History Narrative  . None   Additional History:   Assessment:    Musculoskeletal: Strength & Muscle Tone: within normal limits Gait & Station: normal Patient leans: Backward and N/A  Psychiatric Specialty Exam: Anxiety  Symptoms include nervous/anxious behavior (He reports this is responding well to her current medications.). Patient reports no insomnia or suicidal ideas.    Medication Refill     Review of Systems  Psychiatric/Behavioral: Negative for depression, hallucinations, memory loss, substance abuse and suicidal ideas. The patient is nervous/anxious (He reports this is responding well to her current medications.). The patient does not have insomnia.   All other systems reviewed and are negative.   Blood pressure 114/65, pulse 78, height 5\' 4"  (1.626 m), weight 148 lb 9.6 oz (67.4 kg).Body mass index is 25.51 kg/m.  General Appearance: Neat and Well Groomed  Eye Contact:  Good  Speech:  Clear and Coherent and Normal Rate  Volume:  Normal  Mood:  good  Affect:  Appropriate and Congruent  Thought Process:  Linear and Logical  Orientation:  Full (Time, Place, and Person)  Thought Content:  Negative  Suicidal Thoughts:  No  Homicidal Thoughts:  No  Memory:  Immediate;   Good Recent;   Good Remote;   Good  Judgement:  Good  Insight:  Good  Psychomotor Activity:  Negative  Concentration:  Good  Recall:  Good  Fund of Knowledge: Good  Language: Good  Akathisia:  Negative  Handed:  Right unknown  AIMS (if indicated):  Not done  Assets:  Communication Skills Desire for Improvement  ADL's:  Intact  Cognition: WNL  Sleep:  poor   Is the patient at risk to self?  No. Has the patient been a risk to self in the past 6 months?  No. Has the patient been a risk to self within the distant past?  No. Is the patient a risk to others?  No. Has the patient been a risk to others in the past 6 months?  No. Has the patient been a risk to others within the distant past?  No.  Current Medications: Current Outpatient Prescriptions   Medication Sig Dispense Refill  . ALPRAZolam (XANAX) 0.5 MG tablet Take 1 tablet (0.5 mg total) by mouth 2 (two) times daily as needed for anxiety. (Patient not taking: Reported on 03/14/2016) 60 tablet 4  . busPIRone (BUSPAR) 15 MG tablet Take 1 tablet (15 mg total) by mouth 2 (two) times daily. 60 tablet 4   No current facility-administered medications for this visit.     Medical Decision Making:  Established Problem, Stable/Improving (1) at this point early in the trial of Lexapro, however tolerating medication without any side effects. Switched her from Vistaril to BuSpar secondary to a rash with the Vistaril.  Treatment Plan Summary:Medication management.   Generalized anxiety disorder Continue  BuSpar to 15 mg twice daily.  Patient aware that her Xanax will not be filled at this time. She is okay with that. She would like to follow up with a different clinician. It was told that patient should call back on Tuesday and requests to see a different physician.   Shailyn Weyandt 03/14/2016, 2:36 PM

## 2016-10-12 ENCOUNTER — Encounter: Payer: Self-pay | Admitting: Emergency Medicine

## 2016-10-12 ENCOUNTER — Emergency Department
Admission: EM | Admit: 2016-10-12 | Discharge: 2016-10-12 | Disposition: A | Discharge status: Home / Self Care | Payer: Medicaid Other | Attending: Emergency Medicine | Admitting: Emergency Medicine

## 2016-10-12 DIAGNOSIS — J020 Streptococcal pharyngitis: Secondary | ICD-10-CM | POA: Insufficient documentation

## 2016-10-12 DIAGNOSIS — F17210 Nicotine dependence, cigarettes, uncomplicated: Secondary | ICD-10-CM | POA: Insufficient documentation

## 2016-10-12 LAB — POCT RAPID STREP A: Streptococcus, Group A Screen (Direct): POSITIVE — AB

## 2016-10-12 MED ORDER — LIDOCAINE VISCOUS 2 % MT SOLN: 10.0000 mL | OROMUCOSAL | 0 refills | Status: DC | PRN

## 2016-10-12 MED ORDER — AMOXICILLIN 500 MG PO TABS: 500.0000 mg | ORAL_TABLET | Freq: Two times a day (BID) | ORAL | 0 refills | Status: DC

## 2016-10-12 NOTE — ED Notes (Signed)

## 2016-10-12 NOTE — ED Triage Notes (Signed)
C/O sore throat x 4 days.  Has been taking chloro septic spray, with no relief.

## 2016-10-12 NOTE — ED Provider Notes (Signed)
[facility] Emergency Department Provider Note  ____________________________________________  Time seen: Approximately 6:10 PM  I have reviewed the triage vital signs and the nursing notes.   HISTORY  Chief Complaint Sore Throat    HPI Chelsea Joseph is a 26 y.o. female that presents to the emergency department with 4 days of a sore throat and chills. Pain radiates into her right ear. She has not taken her temperature. Patient initially thought it was allergies and has taken several allergy medicines for pain. She has also tried several over-the-counter cold medications. She came to the ED because she wants to feel better for her kids Easter egg on in the morning. She denies congestion, cough, shortness of breath, chest pain, nausea, vomiting, abdominal pain.   Past Medical History:  Diagnosis Date  . Anxiety   . Depression     Patient Active Problem List   Diagnosis Date Noted  . Anxiety, generalized 01/06/2015  . Depression, major, recurrent, moderate (HCC) 01/06/2015  . UTI (lower urinary tract infection) 12/08/2012  . Bacterial vaginosis 12/08/2012    History reviewed. No pertinent surgical history.  Prior to Admission medications   Medication Sig Start Date End Date Taking? Authorizing Provider  ALPRAZolam Prudy Feeler) 0.5 MG tablet Take 1 tablet (0.5 mg total) by mouth 2 (two) times daily as needed for anxiety. Patient not taking: Reported on 03/14/2016 08/04/15   Kerin Salen, MD  amoxicillin (AMOXIL) 500 MG tablet Take 1 tablet (500 mg total) by mouth 2 (two) times daily. 10/12/16   Enid Derry, PA-C  busPIRone (BUSPAR) 15 MG tablet Take 1 tablet (15 mg total) by mouth 2 (two) times daily. 03/14/16   Himabindu Ravi, MD  lidocaine (XYLOCAINE) 2 % solution Use as directed 10 mLs in the mouth or throat as needed for mouth pain. 10/12/16   Enid Derry, PA-C    Allergies Vistaril [hydroxyzine hcl]  Family History  Problem Relation Age of  Onset  . Diabetes Mother   . Heart attack Mother   . Hypertension Mother   . Alcohol abuse Father   . Anxiety disorder Sister   . Panic disorder Sister     Social History Social History  Substance Use Topics  . Smoking status: Current Every Day Smoker    Packs/day: 1.00    Years: 5.00    Types: Cigarettes    Start date: 04/05/2005  . Smokeless tobacco: Never Used  . Alcohol use 0.0 - 2.4 oz/week     Review of Systems  Constitutional: Positive for chills ENT: No upper respiratory complaints. Cardiovascular: No chest pain. Respiratory: No cough. No SOB. Gastrointestinal: No abdominal pain.  No nausea, no vomiting.  Musculoskeletal: Negative for musculoskeletal pain. Skin: Negative for rash, abrasions, lacerations, ecchymosis.   ____________________________________________   PHYSICAL EXAM:  VITAL SIGNS: ED Triage Vitals [10/12/16 1730]  Enc Vitals Group     BP      Pulse      Resp      Temp      Temp src      SpO2      Weight 150 lb (68 kg)     Height  (1.651 m)     Head Circumference      Peak Flow      Pain Score 10     Pain Loc      Pain Edu?      Excl. in GC?      Constitutional: Alert and oriented. Well appearing and in  no acute distress. Eyes: Conjunctivae are normal. PERRL. EOMI. Head: Atraumatic. ENT:      Ears: Tympanic membranes pearly gray with good landmarks.      Nose: No congestion/rhinnorhea.      Mouth/Throat: Mucous membranes are moist.  Oropharynx erythematous. Tonsils not enlarged. No exudates. Uvula midline. Neck: No stridor.  Cardiovascular: Normal rate, regular rhythm.  Good peripheral circulation. Respiratory: Normal respiratory effort without tachypnea or retractions. Lungs CTAB. Good air entry to the bases with no decreased or absent breath sounds. Gastrointestinal: Bowel sounds 4 quadrants. Soft and nontender to palpation. No guarding or rigidity. No palpable masses. No distention.  Musculoskeletal: Full range of motion  to all extremities. No gross deformities appreciated. Neurologic:  Normal speech and language. No gross focal neurologic deficits are appreciated.  Skin:  Skin is warm, dry and intact. No rash noted.   ____________________________________________   LABS (all labs ordered are listed, but only abnormal results are displayed)  Labs Reviewed  POCT RAPID STREP A - Abnormal; Notable for the following:       Result Value   Streptococcus, Group A Screen (Direct) POSITIVE (*)    All other components within normal limits   ____________________________________________  EKG   ____________________________________________  RADIOLOGY   No results found.  ____________________________________________    PROCEDURES  Procedure(s) performed:    Procedures    Medications - No data to display   ____________________________________________   INITIAL IMPRESSION / ASSESSMENT AND PLAN / ED COURSE  Pertinent labs & imaging results that were available during my care of the patient were reviewed by me and considered in my medical decision making (see chart for details).  Review of the [place] CSRS was performed in accordance of the [facility] prior to dispensing any controlled drugs.   Patient's diagnosis is consistent with strep pharyngitis. Vital signs and exam are reassuring. Strep test positive. She appears well in ED. Patient will be discharged home with prescriptions for amoxicillin and viscous lidocaine. Patient is to follow up with PCP as directed. Patient is given ED precautions to return to the ED for any worsening or new symptoms.     ____________________________________________  FINAL CLINICAL IMPRESSION(S) / ED DIAGNOSES  Final diagnoses:  Strep throat      NEW MEDICATIONS STARTED DURING THIS VISIT:  Discharge Medication List as of 10/12/2016  6:14 PM    START taking these medications   Details  amoxicillin (AMOXIL) 500 MG tablet Take 1 tablet (500 mg total) by mouth 2  (two) times daily., Starting Sat 10/12/2016, Print    lidocaine (XYLOCAINE) 2 % solution Use as directed 10 mLs in the mouth or throat as needed for mouth pain., Starting Sat 10/12/2016, Print            This chart was dictated using voice recognition software/Dragon. Despite best efforts to proofread, errors can occur which can change the meaning. Any change was purely unintentional.    Enid Derry, PA-C 10/12/16 1828    Arnaldo Natal, MD 10/13/16 [phone]

## 2017-03-03 ENCOUNTER — Encounter (HOSPITAL_COMMUNITY): Payer: Self-pay | Admitting: Emergency Medicine

## 2017-03-03 ENCOUNTER — Emergency Department (HOSPITAL_COMMUNITY)
Admission: EM | Admit: 2017-03-03 | Discharge: 2017-03-03 | Disposition: A | Discharge status: Home / Self Care | Payer: Medicaid Other | Attending: Emergency Medicine | Admitting: Emergency Medicine

## 2017-03-03 DIAGNOSIS — Z5321 Procedure and treatment not carried out due to patient leaving prior to being seen by health care provider: Secondary | ICD-10-CM | POA: Insufficient documentation

## 2017-03-03 DIAGNOSIS — M549 Dorsalgia, unspecified: Secondary | ICD-10-CM | POA: Insufficient documentation

## 2017-03-03 LAB — URINALYSIS, ROUTINE W REFLEX MICROSCOPIC
BILIRUBIN URINE: NEGATIVE
GLUCOSE, UA: NEGATIVE mg/dL
HGB URINE DIPSTICK: NEGATIVE
Ketones, ur: NEGATIVE mg/dL
NITRITE: NEGATIVE
PROTEIN: NEGATIVE mg/dL
Specific Gravity, Urine: 1.019 (ref 1.005–1.030)
pH: 6 (ref 5.0–8.0)

## 2017-03-03 LAB — CBC
HEMATOCRIT: 40.3 % (ref 36.0–46.0)
HEMOGLOBIN: 13 g/dL (ref 12.0–15.0)
MCH: 30 pg (ref 26.0–34.0)
MCHC: 32.3 g/dL (ref 30.0–36.0)
MCV: 93.1 fL (ref 78.0–100.0)
Platelets: 226 10*3/uL (ref 150–400)
RBC: 4.33 MIL/uL (ref 3.87–5.11)
RDW: 13 % (ref 11.5–15.5)
WBC: 6.7 10*3/uL (ref 4.0–10.5)

## 2017-03-03 LAB — BASIC METABOLIC PANEL
ANION GAP: 6 (ref 5–15)
BUN: 7 mg/dL (ref 6–20)
CALCIUM: 8.8 mg/dL — AB (ref 8.9–10.3)
CO2: 26 mmol/L (ref 22–32)
Chloride: 105 mmol/L (ref 101–111)
Creatinine, Ser: 0.83 mg/dL (ref 0.44–1.00)
Glucose, Bld: 120 mg/dL — ABNORMAL HIGH (ref 65–99)
Potassium: 3.8 mmol/L (ref 3.5–5.1)
SODIUM: 137 mmol/L (ref 135–145)

## 2017-03-03 LAB — I-STAT BETA HCG BLOOD, ED (MC, WL, AP ONLY): I-stat hCG, quantitative: <5 m[IU]/mL (ref <5)

## 2017-03-03 LAB — CBG MONITORING, ED: Glucose-Capillary: 139 mg/dL — ABNORMAL HIGH (ref 65–99)

## 2017-03-03 MED ORDER — OXYCODONE-ACETAMINOPHEN 5-325 MG PO TABS
1.0000 | ORAL_TABLET | ORAL | Status: DC | PRN
  Administered 2017-03-03: 1 via ORAL

## 2017-03-03 MED ORDER — OXYCODONE-ACETAMINOPHEN 5-325 MG PO TABS
ORAL_TABLET | ORAL | Status: AC
  Filled 2017-03-03: qty 1

## 2017-03-03 NOTE — ED Triage Notes (Signed)
Pt ambulatory to desk again, stating she cannot stay and will just go to [facility] in the morning for further eval. Discussed with patient to return at anytime if she feels she needs reeval
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
## 2017-03-03 NOTE — ED Notes (Signed)
Patient was called to reassessment of vital signs 2x with no answer.

## 2017-03-05 ENCOUNTER — Emergency Department
Admission: EM | Admit: 2017-03-05 | Discharge: 2017-03-05 | Disposition: A | Discharge status: Home / Self Care | Payer: No Typology Code available for payment source | Attending: Emergency Medicine | Admitting: Emergency Medicine

## 2017-03-05 ENCOUNTER — Encounter: Payer: Self-pay | Admitting: Intensive Care

## 2017-03-05 DIAGNOSIS — S161XXA Strain of muscle, fascia and tendon at neck level, initial encounter: Secondary | ICD-10-CM | POA: Insufficient documentation

## 2017-03-05 DIAGNOSIS — Y999 Unspecified external cause status: Secondary | ICD-10-CM | POA: Insufficient documentation

## 2017-03-05 DIAGNOSIS — Y9389 Activity, other specified: Secondary | ICD-10-CM | POA: Diagnosis not present

## 2017-03-05 DIAGNOSIS — S39012A Strain of muscle, fascia and tendon of lower back, initial encounter: Secondary | ICD-10-CM | POA: Insufficient documentation

## 2017-03-05 DIAGNOSIS — F17210 Nicotine dependence, cigarettes, uncomplicated: Secondary | ICD-10-CM | POA: Insufficient documentation

## 2017-03-05 DIAGNOSIS — Z79899 Other long term (current) drug therapy: Secondary | ICD-10-CM | POA: Diagnosis not present

## 2017-03-05 DIAGNOSIS — S199XXA Unspecified injury of neck, initial encounter: Secondary | ICD-10-CM | POA: Diagnosis present

## 2017-03-05 DIAGNOSIS — Y929 Unspecified place or not applicable: Secondary | ICD-10-CM | POA: Insufficient documentation

## 2017-03-05 DIAGNOSIS — M7918 Myalgia, other site: Secondary | ICD-10-CM

## 2017-03-05 MED ORDER — CYCLOBENZAPRINE HCL 10 MG PO TABS: 10.0000 mg | ORAL_TABLET | Freq: Three times a day (TID) | ORAL | 0 refills | Status: DC | PRN

## 2017-03-05 MED ORDER — TRAMADOL HCL 50 MG PO TABS: 50.0000 mg | ORAL_TABLET | Freq: Four times a day (QID) | ORAL | 0 refills | Status: DC | PRN

## 2017-03-05 MED ORDER — IBUPROFEN 600 MG PO TABS: 600.0000 mg | ORAL_TABLET | Freq: Three times a day (TID) | ORAL | 0 refills | Status: DC | PRN
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
## 2017-09-26 ENCOUNTER — Other Ambulatory Visit: Payer: Self-pay

## 2017-09-26 ENCOUNTER — Encounter: Payer: Self-pay | Admitting: Emergency Medicine

## 2017-09-26 ENCOUNTER — Emergency Department
Admission: EM | Admit: 2017-09-26 | Discharge: 2017-09-26 | Disposition: A | Discharge status: Home / Self Care | Payer: Self-pay | Attending: Emergency Medicine | Admitting: Emergency Medicine

## 2017-09-26 DIAGNOSIS — N390 Urinary tract infection, site not specified: Secondary | ICD-10-CM | POA: Insufficient documentation

## 2017-09-26 DIAGNOSIS — M5440 Lumbago with sciatica, unspecified side: Secondary | ICD-10-CM | POA: Insufficient documentation

## 2017-09-26 DIAGNOSIS — F17210 Nicotine dependence, cigarettes, uncomplicated: Secondary | ICD-10-CM | POA: Insufficient documentation

## 2017-09-26 DIAGNOSIS — R109 Unspecified abdominal pain: Secondary | ICD-10-CM

## 2017-09-26 DIAGNOSIS — Z79899 Other long term (current) drug therapy: Secondary | ICD-10-CM | POA: Insufficient documentation

## 2017-09-26 LAB — URINALYSIS, COMPLETE (UACMP) WITH MICROSCOPIC
Bacteria, UA: NONE SEEN
Bilirubin Urine: NEGATIVE
GLUCOSE, UA: NEGATIVE mg/dL
HGB URINE DIPSTICK: NEGATIVE
Ketones, ur: NEGATIVE mg/dL
NITRITE: NEGATIVE
PH: 6 (ref 5.0–8.0)
Protein, ur: 30 mg/dL — AB
SPECIFIC GRAVITY, URINE: 1.031 — AB (ref 1.005–1.030)

## 2017-09-26 LAB — POCT PREGNANCY, URINE: PREG TEST UR: NEGATIVE

## 2017-09-26 MED ORDER — BACLOFEN 10 MG PO TABS: 10.0000 mg | ORAL_TABLET | Freq: Every day | ORAL | 1 refills | Status: AC

## 2017-09-26 MED ORDER — KETOROLAC TROMETHAMINE 30 MG/ML IJ SOLN
30.0000 mg | Freq: Once | INTRAMUSCULAR | Status: DC
  Filled 2017-09-26: qty 1

## 2017-09-26 MED ORDER — SULFAMETHOXAZOLE-TRIMETHOPRIM 800-160 MG PO TABS: 1.0000 | ORAL_TABLET | Freq: Two times a day (BID) | ORAL | 0 refills | Status: DC

## 2017-09-26 MED ORDER — TRAMADOL HCL 50 MG PO TABS: 50.0000 mg | ORAL_TABLET | Freq: Four times a day (QID) | ORAL | 0 refills | Status: DC | PRN

## 2017-09-26 NOTE — ED Notes (Signed)
Pt took Tylenol around 8:20 this am.

## 2017-09-26 NOTE — ED Notes (Signed)
See triage note  Presents with right flank pain for couple of days states pain is non radiating  Also states voiding small amts  Denies any injury  Ambulates well to treatment

## 2017-09-26 NOTE — ED Provider Notes (Signed)
[facility] Emergency Department Provider Note  ____________________________________________   First MD Initiated Contact with Patient 09/26/17 [phone]     (approximate)  I have reviewed the triage vital signs and the nursing notes.   HISTORY  Chief Complaint Back Pain    HPI Joyous Mabry is a 27 y.o. female complaining of right flank pain for couple days.  She states it is nonradiating.  She has been voiding small amounts of urine.  She states the pain is harsh.  She states it hurts with movement.  She denies any fever or chills.  Denies any vomiting or diarrhea.  She denies any vaginal discharge  Past Medical History:  Diagnosis Date  . Anxiety   . Depression     Patient Active Problem List   Diagnosis Date Noted  . Anxiety, generalized 01/06/2015  . Depression, major, recurrent, moderate (HCC) 01/06/2015  . UTI (lower urinary tract infection) 12/08/2012  . Bacterial vaginosis 12/08/2012    History reviewed. No pertinent surgical history.  Prior to Admission medications   Medication Sig Start Date End Date Taking? Authorizing Provider  baclofen (LIORESAL) 10 MG tablet Take 1 tablet (10 mg total) by mouth daily. 09/26/17 09/26/18  Aqua Denslow, Roselyn Bering, PA-C  busPIRone (BUSPAR) 15 MG tablet Take 1 tablet (15 mg total) by mouth 2 (two) times daily. 03/14/16   Patrick North, MD  sulfamethoxazole-trimethoprim (BACTRIM DS,SEPTRA DS) 800-160 MG tablet Take 1 tablet by mouth 2 (two) times daily. 09/26/17   Maryama Kuriakose, Roselyn Bering, PA-C  traMADol (ULTRAM) 50 MG tablet Take 1 tablet (50 mg total) by mouth every 6 (six) hours as needed. 09/26/17   Sherrie Mustache Roselyn Bering, PA-C    Allergies Vistaril [hydroxyzine hcl]  Family History  Problem Relation Age of Onset  . Diabetes Mother   . Heart attack Mother   . Hypertension Mother   . Alcohol abuse Father   . Anxiety disorder Sister   . Panic disorder Sister     Social History Social History   Tobacco Use  .  Smoking status: Current Every Day Smoker    Packs/day: 1.00    Years: 5.00    Pack years: 5.00    Types: Cigarettes    Start date: 04/05/2005  . Smokeless tobacco: Never Used  Substance Use Topics  . Alcohol use: Yes    Alcohol/week: 0.0 - 2.4 oz  . Drug use: No    Review of Systems  Constitutional: No fever/chills Eyes: No visual changes. ENT: No sore throat. Respiratory: Denies cough Gastrointestinal: Positive for flank pain Genitourinary: Negative for dysuria.  Positive for decreased urination Musculoskeletal: Negative for back pain. Skin: Negative for rash.    ____________________________________________   PHYSICAL EXAM:  VITAL SIGNS: ED Triage Vitals  Enc Vitals Group     BP 09/26/17 0916 104/65     Pulse Rate 09/26/17 0916 76     Resp 09/26/17 0916 16     Temp 09/26/17 0916 98 F (36.7 C)     Temp Source 09/26/17 0916 Oral     SpO2 09/26/17 0916 100 %     Weight 09/26/17 0917 172 lb (78 kg)     Height 09/26/17 0917 5\' 5"  (1.651 m)     Head Circumference --      Peak Flow --      Pain Score 09/26/17 0917 10     Pain Loc --      Pain Edu? --      Excl. in GC? --  Constitutional: Alert and oriented. Well appearing and in no acute distress. Eyes: Conjunctivae are normal.  Head: Atraumatic. Nose: No congestion/rhinnorhea. Mouth/Throat: Mucous membranes are moist.   Cardiovascular: Normal rate, regular rhythm.  Heart sounds are normal,  Respiratory: Normal respiratory effort.  No retractions, lungs are clear to auscultation Abdomen: Soft nontender, positive for CVA tenderness on the right side GU: deferred Musculoskeletal: FROM all extremities, warm and well perfused Neurologic:  Normal speech and language.  Skin:  Skin is warm, dry and intact. No rash noted. Psychiatric: Mood and affect are normal. Speech and behavior are normal.  ____________________________________________   LABS (all labs ordered are listed, but only abnormal results are  displayed)  Labs Reviewed  URINALYSIS, COMPLETE (UACMP) WITH MICROSCOPIC - Abnormal; Notable for the following components:      Result Value   Color, Urine AMBER (*)    APPearance HAZY (*)    Specific Gravity, Urine 1.031 (*)    Protein, ur 30 (*)    Leukocytes, UA TRACE (*)    Squamous Epithelial / LPF 0-5 (*)    All other components within normal limits  POCT PREGNANCY, URINE  POC URINE PREG, ED   ____________________________________________   ____________________________________________  RADIOLOGY    ____________________________________________   PROCEDURES  Procedure(s) performed: No  Procedures    ____________________________________________   INITIAL IMPRESSION / ASSESSMENT AND PLAN / ED COURSE  Pertinent labs & imaging results that were available during my care of the patient were reviewed by me and considered in my medical decision making (see chart for details).  Patient is 27 year old female complaining of right-sided flank pain.  She states she has had frequent UTIs.  Physical exam she appears well.  She has pain with movement.  She also has CVA tenderness.  UA is positive for leuks and protein  Diagnosis is acute UTI, back pain  Patient was given a prescription for Septra DS, baclofen, tramadol  She was given the test results.  She was discharged in stable condition.  She is to follow-up with the acute care or her regular doctor if not better in 3-5 days.  To emergency department if worsening.      As part of my medical decision making, I reviewed the following data within the electronic MEDICAL RECORD NUMBER Nursing notes reviewed and incorporated, Labs reviewed UA positive for leuks protein, Notes from prior ED visits and [place] Controlled Substance Database  ____________________________________________   FINAL CLINICAL IMPRESSION(S) / ED DIAGNOSES  Final diagnoses:  Acute lower UTI  Low back pain with sciatica, sciatica laterality unspecified,  unspecified back pain laterality, unspecified chronicity  Acute right flank pain      NEW MEDICATIONS STARTED DURING THIS VISIT:  New Prescriptions   BACLOFEN (LIORESAL) 10 MG TABLET    Take 1 tablet (10 mg total) by mouth daily.   SULFAMETHOXAZOLE-TRIMETHOPRIM (BACTRIM DS,SEPTRA DS) 800-160 MG TABLET    Take 1 tablet by mouth 2 (two) times daily.   TRAMADOL (ULTRAM) 50 MG TABLET    Take 1 tablet (50 mg total) by mouth every 6 (six) hours as needed.     Note:  This document was prepared using Dragon voice recognition software and may include unintentional dictation errors.    Faythe GheeFisher, Belle Charlie W, PA-C 09/26/17 1114    Jeanmarie PlantMcShane, James A, MD 09/26/17 [phone]

## 2017-09-26 NOTE — ED Notes (Signed)
First Nurse Note:  Lower back pain X 4 days, worse recently.  Denies urinary sx or injury.

## 2017-09-26 NOTE — Discharge Instructions (Signed)
Follow-up with your regular doctor if not better in 3-5 days.  Use medication as prescribed.  Return to the emergency department if you are worsening.  We ordered a urine culture on your urine.  If you need a different antibiotic the nurse will call you and call in the antibiotic to the drugstore.

## 2017-09-26 NOTE — ED Triage Notes (Signed)
Lower back pain X 2 days worse when she wakes up.  States pain is on right side.  Denies injury or urinary symptoms.  Denies radiation.

## 2017-12-27 ENCOUNTER — Other Ambulatory Visit: Payer: Self-pay

## 2017-12-27 ENCOUNTER — Emergency Department
Admission: EM | Admit: 2017-12-27 | Discharge: 2017-12-27 | Disposition: A | Discharge status: Home / Self Care | Payer: Medicaid Other | Attending: Emergency Medicine | Admitting: Emergency Medicine

## 2017-12-27 ENCOUNTER — Encounter: Payer: Self-pay | Admitting: Emergency Medicine

## 2017-12-27 DIAGNOSIS — T7840XA Allergy, unspecified, initial encounter: Secondary | ICD-10-CM | POA: Insufficient documentation

## 2017-12-27 DIAGNOSIS — R0602 Shortness of breath: Secondary | ICD-10-CM | POA: Insufficient documentation

## 2017-12-27 DIAGNOSIS — F17210 Nicotine dependence, cigarettes, uncomplicated: Secondary | ICD-10-CM | POA: Insufficient documentation

## 2017-12-27 DIAGNOSIS — R202 Paresthesia of skin: Secondary | ICD-10-CM | POA: Insufficient documentation

## 2017-12-27 MED ORDER — MORPHINE SULFATE (PF) 2 MG/ML IV SOLN
2.0000 mg | Freq: Once | INTRAVENOUS | Status: AC
  Administered 2017-12-27: 2 mg via INTRAVENOUS
  Filled 2017-12-27: qty 1

## 2017-12-27 MED ORDER — DIPHENHYDRAMINE HCL 50 MG/ML IJ SOLN
50.0000 mg | Freq: Once | INTRAMUSCULAR | Status: AC
Start: 2017-12-27 — End: 2017-12-27
  Administered 2017-12-27: 50 mg via INTRAVENOUS

## 2017-12-27 MED ORDER — EPINEPHRINE 0.3 MG/0.3ML IJ SOAJ
0.3000 mg | Freq: Once | INTRAMUSCULAR | Status: AC
  Administered 2017-12-27: 0.3 mg via INTRAMUSCULAR

## 2017-12-27 MED ORDER — EPINEPHRINE 0.3 MG/0.3ML IJ SOAJ: 0.3000 mg | Freq: Once | INTRAMUSCULAR | 2 refills | Status: AC

## 2017-12-27 MED ORDER — METHYLPREDNISOLONE SODIUM SUCC 125 MG IJ SOLR
125.0000 mg | Freq: Once | INTRAMUSCULAR | Status: AC
  Administered 2017-12-27: 125 mg via INTRAVENOUS

## 2017-12-27 MED ORDER — PREDNISONE 20 MG PO TABS: 60.0000 mg | ORAL_TABLET | Freq: Once | ORAL | Status: DC

## 2017-12-27 MED ORDER — METHYLPREDNISOLONE SODIUM SUCC 125 MG IJ SOLR
INTRAMUSCULAR | Status: AC
  Administered 2017-12-27: 125 mg via INTRAVENOUS
  Filled 2017-12-27: qty 2

## 2017-12-27 MED ORDER — FAMOTIDINE IN NACL 20-0.9 MG/50ML-% IV SOLN
INTRAVENOUS | Status: AC
  Administered 2017-12-27: 20 mg via INTRAVENOUS
  Filled 2017-12-27: qty 50

## 2017-12-27 MED ORDER — DIPHENHYDRAMINE HCL 50 MG/ML IJ SOLN
INTRAMUSCULAR | Status: AC
  Administered 2017-12-27: 50 mg via INTRAVENOUS
  Filled 2017-12-27: qty 1

## 2017-12-27 MED ORDER — FAMOTIDINE IN NACL 20-0.9 MG/50ML-% IV SOLN
20.0000 mg | Freq: Once | INTRAVENOUS | Status: AC
  Administered 2017-12-27: 20 mg via INTRAVENOUS

## 2017-12-27 MED ORDER — DIPHENHYDRAMINE HCL 50 MG/ML IJ SOLN
50.0000 mg | Freq: Once | INTRAMUSCULAR | Status: AC
  Administered 2017-12-27: 50 mg via INTRAVENOUS
  Filled 2017-12-27: qty 1

## 2017-12-27 MED ORDER — RANITIDINE HCL 150 MG PO TABS: 150.0000 mg | ORAL_TABLET | Freq: Two times a day (BID) | ORAL | 1 refills | Status: DC

## 2017-12-27 NOTE — ED Triage Notes (Signed)
Pt arrived via POV with a sudden onset of eyes swelling, throat swelling, lips swelling, bilateral ear numbness and welts all over body.  Pt given epinephrine on arrival.    Pt states she is feeling better after receiving epi.   Pt states she was inbetween jobs today and did not have any new medications and states she only at a [employer] and drank a sweat tea.

## 2017-12-27 NOTE — ED Notes (Signed)
Pt less flushed/red - no whelps/hives noted - pt able to speak in complete sentences - respirations are even/unlabored - NAD at this time - pt notified to let nurse know if any symptoms return - pt verbalized understanding

## 2017-12-27 NOTE — ED Notes (Signed)
Pt c/o severe abd cramping and at this time appears flushed on arms/legs/chest - respirations are even and unlabored at this time - Dr Darnelle CatalanMalinda notified and VO for benadryl 50mg  IV and Morphine 2mg  IV

## 2017-12-27 NOTE — ED Provider Notes (Signed)
[facility] Emergency Department Provider Note   ____________________________________________   First MD Initiated Contact with Patient 12/27/17 1608     (approximate)  I have reviewed the triage vital signs and the nursing notes.   HISTORY  Chief Complaint Allergic Reaction  Allergic reaction  HPI Chelsea Joseph is a 27 y.o. female who reports she ate at [employer] just a chicken sandwich and was on her way to her second job and began to have lip tingling and itching everywhere she got all red and bumpy and so she came to the emergency room her friend drove her.  Here she says she is feels feeling like she is getting short of breath.  We gave her a EpiPen and Benadryl Zantac and Solu-Medrol.  New foods any new medicines any new anything she is not sure what this came from.   Past Medical History:  Diagnosis Date  . Anxiety   . Depression     Patient Active Problem List   Diagnosis Date Noted  . Anxiety, generalized 01/06/2015  . Depression, major, recurrent, moderate (HCC) 01/06/2015  . UTI (lower urinary tract infection) 12/08/2012  . Bacterial vaginosis 12/08/2012    History reviewed. No pertinent surgical history.  Prior to Admission medications   Medication Sig Start Date End Date Taking? Authorizing Provider  baclofen (LIORESAL) 10 MG tablet Take 1 tablet (10 mg total) by mouth daily. Patient not taking: Reported on 12/27/2017 09/26/17 09/26/18  Sherrie Mustache Roselyn Bering, PA-C  EPINEPHrine (EPIPEN 2-PAK) 0.3 mg/0.3 mL IJ SOAJ injection Inject 0.3 mLs (0.3 mg total) into the muscle once for 1 dose. 12/27/17 12/27/17  Arnaldo Natal, MD  ranitidine (ZANTAC) 150 MG tablet Take 1 tablet (150 mg total) by mouth 2 (two) times daily. Take 1 in the morning and 1 tomorrow evening 12/27/17 12/27/18  Arnaldo Natal, MD  sulfamethoxazole-trimethoprim (BACTRIM DS,SEPTRA DS) 800-160 MG tablet Take 1 tablet by mouth 2 (two) times daily. Patient not taking: Reported  on 12/27/2017 09/26/17   Faythe Ghee, PA-C  traMADol (ULTRAM) 50 MG tablet Take 1 tablet (50 mg total) by mouth every 6 (six) hours as needed. Patient not taking: Reported on 12/27/2017 09/26/17   Faythe Ghee, PA-C    Allergies Vistaril [hydroxyzine hcl]  Family History  Problem Relation Age of Onset  . Diabetes Mother   . Heart attack Mother   . Hypertension Mother   . Alcohol abuse Father   . Anxiety disorder Sister   . Panic disorder Sister     Social History Social History   Tobacco Use  . Smoking status: Current Every Day Smoker    Packs/day: 1.00    Years: 5.00    Pack years: 5.00    Types: Cigarettes    Start date: 04/05/2005  . Smokeless tobacco: Never Used  Substance Use Topics  . Alcohol use: Yes    Alcohol/week: 0.0 - 2.4 oz  . Drug use: No    Review of Systems  Constitutional: No fever/chills Eyes: No visual changes. ENT: No sore throat. Cardiovascular: Denies chest pain. Respiratory:  shortness of breath. Gastrointestinal: No abdominal pain.  No nausea, no vomiting.  No diarrhea.  No constipation. Genitourinary: Negative for dysuria. Musculoskeletal: Negative for back pain. Skin: Negative for rash. Neurological: Negative for headaches, focal weakness   ____________________________________________   PHYSICAL EXAM:  VITAL SIGNS: ED Triage Vitals  Enc Vitals Group     BP      Pulse  Resp      Temp      Temp src      SpO2      Weight      Height      Head Circumference      Peak Flow      Pain Score      Pain Loc      Pain Edu?      Excl. in GC?     Constitutional: Alert and oriented.  Red with hives all over her body Eyes: Conjunctivae are normal.  Head: Atraumatic. Nose: No congestion/rhinnorhea. Mouth/Throat: Mucous membranes are moist.  Oropharynx non-erythematous. Neck: No stridor.   Cardiovascular: Normal rate, regular rhythm. Grossly normal heart sounds.  Good peripheral circulation. Respiratory: Normal respiratory  effort.  No retractions. Lungs CTAB. Gastrointestinal: Soft and nontender. No distention. No abdominal bruits. No CVA tenderness. Musculoskeletal: No lower extremity tenderness nor edema.  No joint effusions. Neurologic:  Normal speech and language. No gross focal neurologic deficits are appreciated. No gait instability. Skin:  Skin is warm, dry red with hives all over Psychiatric: Mood and affect are normal. Speech and behavior are normal.  ____________________________________________   LABS (all labs ordered are listed, but only abnormal results are displayed)  Labs Reviewed - No data to display ____________________________________________  EKG  _______________________________________  RADIOLOGY  ED MD interpretation:    Official radiology report(s): No results found.  ____________________________________________   PROCEDURES  Procedure(s) performed:   Procedures  Critical Care performed:   ____________________________________________   INITIAL IMPRESSION / ASSESSMENT AND PLAN / ED COURSE  Patient doing well no further episodes of itching or redness not short of breath she knows to return if she has any problems I will let her go have given her the prescription for EpiPen she knows to take Benadryl and have her given her also prescription for Zantac.  In the emergency room she had Benadryl IV twice EpiPen IM once and Pepcid IV.        ____________________________________________   FINAL CLINICAL IMPRESSION(S) / ED DIAGNOSES  Final diagnoses:  Allergic reaction, initial encounter     ED Discharge Orders        Ordered    ranitidine (ZANTAC) 150 MG tablet  2 times daily     12/27/17 2008    EPINEPHrine (EPIPEN 2-PAK) 0.3 mg/0.3 mL IJ SOAJ injection   Once     12/27/17 2008       Note:  This document was prepared using Dragon voice recognition software and may include unintentional dictation errors.    Arnaldo NatalMalinda, Paul F, MD 12/27/17 [phone]

## 2017-12-27 NOTE — Discharge Instructions (Signed)
If you get any other serious allergic reaction like the one you had with hives all over and feeling short of breath or you have swelling in your mouth and shortness of breath or if you get very weak and lightheaded use the EpiPen and then call 911.  Tonight I want you to take 2 over-the-counter Benadryl's one more time before you go to bed.  Tomorrow morning I want you to start taking the Benadryl again 2 of the over-the-counter pills 4 times a day, 1 prednisone in the afternoon and 1 Zantac in the morning and 1 in the evening.  Please return here if you begin to have another allergic reaction.  Please follow-up with your regular doctor this coming week.  He may want to refer you to an allergist for skin testing.

## 2017-12-27 NOTE — ED Notes (Signed)
Pt c/o nausea and abd cramping - refused vital signs until she could go to bathroom - pt ambulated to bathroom and mother to let this nurse know when she returns

## 2017-12-27 NOTE — ED Notes (Signed)
Pt states she is feeling much better after medications, respirations are equal and unlabored, no distress noted. Pt able to speak clearly in complete sentences without running out of breath. Will continue to monitor.   Dr. Darnelle CatalanMalinda already informed patient she would be in the ED under observation for the next 2 hours and pt was agreeable with plan.

## 2018-03-24 LAB — HM HIV SCREENING LAB: HM HIV Screening: NEGATIVE

## 2018-12-24 ENCOUNTER — Other Ambulatory Visit: Payer: Self-pay

## 2018-12-24 ENCOUNTER — Encounter: Payer: Self-pay | Admitting: Emergency Medicine

## 2018-12-24 DIAGNOSIS — S0501XA Injury of conjunctiva and corneal abrasion without foreign body, right eye, initial encounter: Secondary | ICD-10-CM | POA: Insufficient documentation

## 2018-12-24 DIAGNOSIS — Y998 Other external cause status: Secondary | ICD-10-CM | POA: Insufficient documentation

## 2018-12-24 DIAGNOSIS — W500XXA Accidental hit or strike by another person, initial encounter: Secondary | ICD-10-CM | POA: Insufficient documentation

## 2018-12-24 DIAGNOSIS — Y9389 Activity, other specified: Secondary | ICD-10-CM | POA: Insufficient documentation

## 2018-12-24 DIAGNOSIS — Y929 Unspecified place or not applicable: Secondary | ICD-10-CM | POA: Insufficient documentation

## 2018-12-24 DIAGNOSIS — F17210 Nicotine dependence, cigarettes, uncomplicated: Secondary | ICD-10-CM | POA: Insufficient documentation
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
## 2018-12-25 ENCOUNTER — Emergency Department
Admission: EM | Admit: 2018-12-25 | Discharge: 2018-12-25 | Disposition: A | Discharge status: Home / Self Care | Payer: Medicaid Other | Attending: Emergency Medicine | Admitting: Emergency Medicine

## 2018-12-25 DIAGNOSIS — S0501XA Injury of conjunctiva and corneal abrasion without foreign body, right eye, initial encounter: Secondary | ICD-10-CM

## 2018-12-25 MED ORDER — TETRACAINE HCL 0.5 % OP SOLN
2.0000 [drp] | Freq: Once | OPHTHALMIC | Status: AC
Start: 2018-12-25 — End: 2018-12-25
  Administered 2018-12-25: 03:00:00 2 [drp] via OPHTHALMIC

## 2018-12-25 MED ORDER — CIPROFLOXACIN HCL 0.3 % OP SOLN: 1.0000 [drp] | OPHTHALMIC | 0 refills | Status: AC

## 2018-12-25 MED ORDER — CIPROFLOXACIN HCL 0.3 % OP SOLN
2.0000 [drp] | Freq: Once | OPHTHALMIC | Status: AC
  Administered 2018-12-25: 04:00:00 2 [drp] via OPHTHALMIC
  Filled 2018-12-25: qty 2.5
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
## 2019-01-11 ENCOUNTER — Ambulatory Visit: Payer: Self-pay

## 2019-02-21 ENCOUNTER — Other Ambulatory Visit: Payer: Self-pay

## 2019-02-21 ENCOUNTER — Emergency Department: Payer: Self-pay

## 2019-02-21 ENCOUNTER — Emergency Department
Admission: EM | Admit: 2019-02-21 | Discharge: 2019-02-21 | Disposition: A | Discharge status: Home / Self Care | Payer: Self-pay | Attending: Emergency Medicine | Admitting: Emergency Medicine

## 2019-02-21 DIAGNOSIS — R112 Nausea with vomiting, unspecified: Secondary | ICD-10-CM | POA: Insufficient documentation

## 2019-02-21 DIAGNOSIS — R1084 Generalized abdominal pain: Secondary | ICD-10-CM | POA: Insufficient documentation

## 2019-02-21 LAB — COMPREHENSIVE METABOLIC PANEL
ALT: 15 U/L (ref 0–44)
AST: 13 U/L — ABNORMAL LOW (ref 15–41)
Albumin: 3.9 g/dL (ref 3.5–5.0)
Alkaline Phosphatase: 54 U/L (ref 38–126)
Anion gap: 8 (ref 5–15)
BUN: 6 mg/dL (ref 6–20)
CO2: 28 mmol/L (ref 22–32)
Calcium: 9.4 mg/dL (ref 8.9–10.3)
Chloride: 102 mmol/L (ref 98–111)
Creatinine, Ser: 0.64 mg/dL (ref 0.44–1.00)
GFR calc Af Amer: >60 mL/min (ref >60)
GFR calc non Af Amer: >60 mL/min (ref >60)
Glucose, Bld: 108 mg/dL — ABNORMAL HIGH (ref 70–99)
Potassium: 3.8 mmol/L (ref 3.5–5.1)
Sodium: 138 mmol/L (ref 135–145)
Total Bilirubin: 0.5 mg/dL (ref 0.3–1.2)
Total Protein: 6.3 g/dL — ABNORMAL LOW (ref 6.5–8.1)

## 2019-02-21 LAB — CBC
HCT: 45.6 % (ref 36.0–46.0)
Hemoglobin: 15.5 g/dL — ABNORMAL HIGH (ref 12.0–15.0)
MCH: 31.1 pg (ref 26.0–34.0)
MCHC: 34 g/dL (ref 30.0–36.0)
MCV: 91.6 fL (ref 80.0–100.0)
Platelets: 321 10*3/uL (ref 150–400)
RBC: 4.98 MIL/uL (ref 3.87–5.11)
RDW: 12.3 % (ref 11.5–15.5)
WBC: 11.5 10*3/uL — ABNORMAL HIGH (ref 4.0–10.5)
nRBC: 0 % (ref 0.0–0.2)

## 2019-02-21 LAB — LIPASE, BLOOD: Lipase: 31 U/L (ref 11–51)

## 2019-02-21 MED ORDER — SODIUM CHLORIDE 0.9 % IV BOLUS
1000.0000 mL | Freq: Once | INTRAVENOUS | Status: AC
  Administered 2019-02-21: 1000 mL via INTRAVENOUS

## 2019-02-21 MED ORDER — DICYCLOMINE HCL 10 MG PO CAPS
10.0000 mg | ORAL_CAPSULE | Freq: Once | ORAL | Status: AC
  Administered 2019-02-21: 10 mg via ORAL
  Filled 2019-02-21: qty 1

## 2019-02-21 MED ORDER — ONDANSETRON HCL 4 MG/2ML IJ SOLN
4.0000 mg | INTRAMUSCULAR | Status: AC
  Administered 2019-02-21: 04:00:00 4 mg via INTRAVENOUS
  Filled 2019-02-21: qty 2

## 2019-02-21 MED ORDER — OMEPRAZOLE MAGNESIUM 20 MG PO TBEC: 20.0000 mg | DELAYED_RELEASE_TABLET | Freq: Every day | ORAL | 1 refills | Status: DC

## 2019-02-21 MED ORDER — DICYCLOMINE HCL 10 MG PO CAPS: 10.0000 mg | ORAL_CAPSULE | Freq: Three times a day (TID) | ORAL | 0 refills | Status: DC | PRN

## 2019-02-21 MED ORDER — SUCRALFATE 1 G PO TABS: 1.0000 g | ORAL_TABLET | Freq: Four times a day (QID) | ORAL | 1 refills | Status: DC | PRN

## 2019-02-21 MED ORDER — ONDANSETRON 4 MG PO TBDP: ORAL_TABLET | ORAL | 0 refills | Status: DC

## 2019-02-21 MED ORDER — MORPHINE SULFATE (PF) 4 MG/ML IV SOLN
4.0000 mg | Freq: Once | INTRAVENOUS | Status: AC
  Administered 2019-02-21: 4 mg via INTRAVENOUS
  Filled 2019-02-21: qty 1

## 2019-02-21 NOTE — ED Triage Notes (Signed)
Patient c/o abdominal pain and emesis beginning Sunday.

## 2019-02-21 NOTE — Discharge Instructions (Signed)
You have been seen in the Emergency Department (ED) for persistent nausea and vomiting as well as some abdominal pain.  Your evaluation did not identify a clear cause of your symptoms but was generally reassuring.  I prescribed you some medications that I encourage you to take according to label instructions, and I provided follow-up information to help you establish care with a gastroenterologist who may be able to offer some additional insight and recommendations for evaluation and treatment of your symptoms.  You may also wish to try Tylenol 1000 mg no more frequently than every 6 hours which may be helpful in controlling your symptoms as well.  Please follow up as instructed above regarding todays emergent visit and the symptoms that are bothering you.  Return to the ED if your abdominal pain worsens or fails to improve, you develop bloody vomiting, bloody diarrhea, you are unable to tolerate fluids due to vomiting, fever greater than 101, or other symptoms that concern you.

## 2019-02-21 NOTE — ED Notes (Signed)
Pt reports one week of abd pain; says pain was all over abd for the first 4 days but seems to have settled in the right upper quadrant and epigastric area since; nauseated all the time, vomits after trying to eat or drink something; says stools seem a little darker but otherwise normal; denies urinary s/s; pt understands NPO until seen and allowed by provider;

## 2019-02-21 NOTE — ED Notes (Signed)
Dr Forbach in to follow up 

## 2019-02-21 NOTE — ED Provider Notes (Addendum)
[facility] Emergency Department Provider Note  ____________________________________________   First MD Initiated Contact with Patient 02/21/19 0335     (approximate)  I have reviewed the triage vital signs and the nursing notes.   HISTORY  Chief Complaint Abdominal Pain    HPI Chelsea Joseph is a 28 y.o. female with medical/psychiatric history as listed below who presents for evaluation of persistent nausea and vomiting further the last few days as well as some nonspecific aching and occasionally sharp abdominal pain "all over my stomach" which she states that she indicates her lower abdomen.  Of note she did indicate to her nurse that her pain is in the upper part of her abdomen and radiating into the right upper quadrant.  She says that she has had this before a few months ago when [facility] and "they did not tell me what was wrong".  She denies contact with COVID-19 patient.  She denies fever, sore throat, chest pain, shortness of breath, cough, dysuria, abnormal vaginal discharge.   Nothing in particular makes her symptoms better and trying to eat or drink anything makes them worse.  She denies alcohol use and she denies drug use although she does occasionally use marijuana.  She reports that the symptoms are severe.        Past Medical History:  Diagnosis Date  . Anxiety   . Depression     Patient Active Problem List   Diagnosis Date Noted  . Anxiety, generalized 01/06/2015  . Depression, major, recurrent, moderate (HCC) 01/06/2015  . UTI (lower urinary tract infection) 12/08/2012  . Bacterial vaginosis 12/08/2012    History reviewed. No pertinent surgical history.  Prior to Admission medications   Medication Sig Start Date End Date Taking? Authorizing Provider  levonorgestrel (MIRENA) 20 MCG/24HR IUD 1 each by Intrauterine route once. 07/15/14  Yes [provider]  dicyclomine (BENTYL) 10 MG capsule Take 1 capsule (10 mg  total) by mouth 3 (three) times daily as needed for up to 14 days for spasms. or abdominal pain 02/21/19 03/07/19  Loleta RoseForbach, Schylar Wuebker, MD  omeprazole (PRILOSEC OTC) 20 MG tablet Take 1 tablet (20 mg total) by mouth daily. 02/21/19 02/21/20  Loleta RoseForbach, Shaheen Mende, MD  ondansetron (ZOFRAN ODT) 4 MG disintegrating tablet Allow 1-2 tablets to dissolve in your mouth every 8 hours as needed for nausea/vomiting 02/21/19   Loleta RoseForbach, Keva Darty, MD  sucralfate (CARAFATE) 1 g tablet Take 1 tablet (1 g total) by mouth 4 (four) times daily as needed (for abdominal discomfort, nausea, and/or vomiting). 02/21/19   Loleta RoseForbach, Anjeanette Petzold, MD    Allergies Vistaril [hydroxyzine hcl]  Family History  Problem Relation Age of Onset  . Diabetes Mother   . Heart attack Mother   . Hypertension Mother   . Alcohol abuse Father   . Anxiety disorder Sister   . Panic disorder Sister     Social History Social History   Tobacco Use  . Smoking status: Current Every Day Smoker    Packs/day: 1.00    Years: 5.00    Pack years: 5.00    Types: Cigarettes    Start date: 04/05/2005  . Smokeless tobacco: Never Used  Substance Use Topics  . Alcohol use: Yes    Alcohol/week: 0.0 - 4.0 standard drinks  . Drug use: No    Review of Systems Constitutional: No fever/chills Eyes: No visual changes. ENT: No sore throat. Cardiovascular: Denies chest pain. Respiratory: Denies shortness of breath. Gastrointestinal: Abdominal pain with nausea and  vomiting as described above Genitourinary: Negative for dysuria.  No abnormal vaginal discharge. Musculoskeletal: Negative for neck pain.  Negative for back pain. Integumentary: Negative for rash. Neurological: Negative for headaches, focal weakness or numbness.   ____________________________________________   PHYSICAL EXAM:  VITAL SIGNS: ED Triage Vitals  Enc Vitals Group     BP 02/21/19 0123 117/68     Pulse Rate 02/21/19 0123 82     Resp 02/21/19 0123 16     Temp 02/21/19 0123 97.8 F (36.6 C)      Temp Source 02/21/19 0123 Oral     SpO2 02/21/19 0123 97 %     Weight 02/21/19 0116 71 kg (156 lb 8.4 oz)     Height 02/21/19 0123 1.651 m (5\' 5" )     Head Circumference --      Peak Flow --      Pain Score 02/21/19 0116 10     Pain Loc --      Pain Edu? --      Excl. in GC? --     Constitutional: Alert and oriented.  Patient does not appear to be in distress. Eyes: Conjunctivae are normal.  Head: Atraumatic. Nose: No congestion/rhinnorhea. Mouth/Throat: Mucous membranes are moist. Neck: No stridor.  No meningeal signs.   Cardiovascular: Normal rate, regular rhythm. Good peripheral circulation. Grossly normal heart sounds. Respiratory: Normal respiratory effort.  No retractions. Gastrointestinal: Soft and nontender including in the epigastrium, right upper quadrant, and right lower quadrant. No distention.   Genitourinary: Pelvic exam refused by patient Musculoskeletal: No lower extremity tenderness nor edema. No gross deformities of extremities. Neurologic:  Normal speech and language. No gross focal neurologic deficits are appreciated.  Skin:  Skin is warm, dry and intact. Psychiatric: Mood and affect are normal. Speech and behavior are normal.  ____________________________________________   LABS (all labs ordered are listed, but only abnormal results are displayed)  Labs Reviewed  CBC - Abnormal; Notable for the following components:      Result Value   WBC 11.5 (*)    Hemoglobin 15.5 (*)    All other components within normal limits  COMPREHENSIVE METABOLIC PANEL - Abnormal; Notable for the following components:   Glucose, Bld 108 (*)    Total Protein 6.3 (*)    AST 13 (*)    All other components within normal limits  LIPASE, BLOOD  URINALYSIS, COMPLETE (UACMP) WITH MICROSCOPIC  POC URINE PREG, ED   ____________________________________________  EKG  ED ECG REPORT I, Loleta Roseory Yoniel Arkwright, the attending physician, personally viewed and interpreted this ECG.  Date:  02/21/2019 EKG Time: 1:22 Rate: 71 Rhythm: normal sinus rhythm QRS Axis: normal Intervals: normal ST/T Wave abnormalities: normal Narrative Interpretation: no evidence of acute ischemia  ____________________________________________  RADIOLOGY I, Loleta Roseory Keenen Roessner, personally viewed and evaluated these images (plain radiographs) as part of my medical decision making, as well as reviewing the written report by the radiologist.  ED MD interpretation:  Normal RUQ ultrasound  Official radiology report(s): [place] Abdomen Limited Ruq  Result Date: 02/21/2019 CLINICAL DATA:  Epigastric pain with nausea and vomiting EXAM: ULTRASOUND ABDOMEN LIMITED RIGHT UPPER QUADRANT COMPARISON:  None. FINDINGS: Gallbladder: No gallstones or wall thickening visualized. No sonographic Murphy sign noted by sonographer. Common bile duct: Diameter: 3 mm Liver: No focal lesion identified. Within normal limits in parenchymal echogenicity. Portal vein is patent on color Doppler imaging with normal direction of blood flow towards the liver. Other: None. IMPRESSION: Normal right upper quadrant ultrasound. Electronically Signed  By: Deatra RobinsonKevin  Herman M.D.   On: 02/21/2019 05:39    ____________________________________________   PROCEDURES   Procedure(s) performed (including Critical Care):  Procedures   ____________________________________________   INITIAL IMPRESSION / MDM / ASSESSMENT AND PLAN / ED COURSE  As part of my medical decision making, I reviewed the following data within the electronic MEDICAL RECORD NUMBER Nursing notes reviewed and incorporated, Labs reviewed , Old chart reviewed, Notes from prior ED visits and [place] Controlled Substance Database   Differential diagnosis includes, but is not limited to, biliary colic, acid reflux or ulcer, nonspecific viral gastritis, cannabinoid hyperemesis syndrome, IBS.  The patient is generally well-appearing and has normal stable vitals.  Her lipase is normal, comprehensive  metabolic panel is reassuring and unremarkable, and CBC is notable only for a very mild elevation of her white blood cell count of 11.5.  She may be slightly hemoconcentrated as well at 15.5, and I will order 1 L normal saline given her hemoconcentration on the CBC and her reported persistent nausea and vomiting during the day.  She has not yet provided a urine specimen so I do not have a urine pregnancy test.  I reviewed the medical record and [facility].  At that time she had lab work and a CT scan of the abdomen and pelvis, all of which were reassuring and unremarkable.  She felt better after medication treatment.  She reports that she felt better for a while until this last week.  She claims obesity she does not smoke marijuana every day and has not smoked today so cannabinoid hyperemesis syndrome slightly less likely.  I will get a treat with a one-time dose of morphine 4 mg IV and Zofran 4 mg IV and will really assess her symptoms after also getting an ultrasound of the right upper quadrant.  Of note, given that she indicates her entire abdomen as a source of discomfort, I brought up the possibility of STD/PID.  I suggested a pelvic exam to obtain specimens and she is very reluctant and does not want to proceed.  I explained my reasoning and give her the opportunity to think about it.  I think it is a less likely diagnosis but worth checking but she declines at this time.      Clinical Course as of Feb 20 613  Wynelle LinkSun Feb 21, 2019  [id number] Unremarkable ultrasound.  I will reassess the patient but anticipate discharge with Carafate, Protonix, and outpatient GI follow-up recommendation.  US ABDOMEN LIMITED RUQ [CF]    Clinical Course User Index [CF] Loleta RoseForbach, Jairo Bellew, MD     ____________________________________________  FINAL CLINICAL IMPRESSION(S) / ED DIAGNOSES  Final diagnoses:  Generalized abdominal pain  Non-intractable vomiting with nausea, unspecified vomiting  type     MEDICATIONS GIVEN DURING THIS VISIT:  Medications  morphine 4 MG/ML injection 4 mg (4 mg Intravenous Given 02/21/19 0343)  ondansetron (ZOFRAN) injection 4 mg (4 mg Intravenous Given 02/21/19 0341)  sodium chloride 0.9 % bolus 1,000 mL (1,000 mLs Intravenous New Bag/Given 02/21/19 0521)     ED Discharge Orders         Ordered    sucralfate (CARAFATE) 1 g tablet  4 times daily PRN     02/21/19 0612    ondansetron (ZOFRAN ODT) 4 MG disintegrating tablet     02/21/19 0612    omeprazole (PRILOSEC OTC) 20 MG tablet  Daily     02/21/19 0612    dicyclomine (BENTYL)  10 MG capsule  3 times daily PRN     02/21/19 0612          *Please note:  Chelsea Joseph was evaluated in Emergency Department on 02/21/2019 for the symptoms described in the history of present illness. She was evaluated in the context of the global COVID-19 pandemic, which necessitated consideration that the patient might be at risk for infection with the SARS-CoV-2 virus that causes COVID-19. Institutional protocols and algorithms that pertain to the evaluation of patients at risk for COVID-19 are in a state of rapid change based on information released by regulatory bodies including the CDC and federal and state organizations. These policies and algorithms were followed during the patient's care in the ED.  Some ED evaluations and interventions may be delayed as a result of limited staffing during the pandemic.*  Note:  This document was prepared using Dragon voice recognition software and may include unintentional dictation errors.   Hinda Kehr, MD 02/21/19 6073    Hinda Kehr, MD 02/21/19 [phone]

## 2019-02-21 NOTE — ED Notes (Signed)
Stayed at bedside during MD assessment; pt reported to MD lower abd pain and tenderness and denied upper abd pain and tenderness at this time; conflicting stories given to RN and MD

## 2019-02-21 NOTE — ED Notes (Signed)
Pt says she is unable to void at this time;

## 2019-02-21 NOTE — ED Notes (Signed)
Portable US completed; pt tolerated well

## 2019-02-21 NOTE — ED Notes (Signed)
Dr Forbach in to see pt 

## 2019-04-20 ENCOUNTER — Ambulatory Visit: Payer: Medicaid Other

## 2019-04-22 ENCOUNTER — Ambulatory Visit: Payer: Medicaid Other

## 2019-06-25 ENCOUNTER — Ambulatory Visit: Payer: Medicaid Other

## 2019-07-01 ENCOUNTER — Ambulatory Visit: Payer: Medicaid Other

## 2019-08-03 ENCOUNTER — Ambulatory Visit: Payer: Medicaid Other

## 2019-09-08 ENCOUNTER — Ambulatory Visit: Payer: Medicaid Other

## 2019-10-11 ENCOUNTER — Telehealth: Payer: Self-pay | Admitting: Obstetrics & Gynecology

## 2019-10-11 NOTE — Telephone Encounter (Signed)
Patient scheduled 4/13 for Mirena replacement with [facility].

## 2019-10-26 ENCOUNTER — Encounter: Payer: Medicaid Other | Admitting: Obstetrics & Gynecology

## 2019-10-26 NOTE — Telephone Encounter (Signed)
Noted. Mirena reserved for this patient. 

## 2019-10-28 ENCOUNTER — Encounter (HOSPITAL_BASED_OUTPATIENT_CLINIC_OR_DEPARTMENT_OTHER): Payer: Self-pay | Admitting: *Deleted

## 2019-10-28 ENCOUNTER — Emergency Department (HOSPITAL_BASED_OUTPATIENT_CLINIC_OR_DEPARTMENT_OTHER)
Admission: EM | Admit: 2019-10-28 | Discharge: 2019-10-28 | Disposition: A | Discharge status: Home / Self Care | Payer: Medicaid Other | Attending: Emergency Medicine | Admitting: Emergency Medicine

## 2019-10-28 ENCOUNTER — Other Ambulatory Visit: Payer: Self-pay

## 2019-10-28 DIAGNOSIS — N3000 Acute cystitis without hematuria: Secondary | ICD-10-CM

## 2019-10-28 DIAGNOSIS — F17210 Nicotine dependence, cigarettes, uncomplicated: Secondary | ICD-10-CM | POA: Insufficient documentation

## 2019-10-28 DIAGNOSIS — N3090 Cystitis, unspecified without hematuria: Secondary | ICD-10-CM | POA: Insufficient documentation

## 2019-10-28 DIAGNOSIS — R300 Dysuria: Secondary | ICD-10-CM | POA: Diagnosis present

## 2019-10-28 LAB — URINALYSIS, ROUTINE W REFLEX MICROSCOPIC
Bilirubin Urine: NEGATIVE
Glucose, UA: NEGATIVE mg/dL
Ketones, ur: NEGATIVE mg/dL
Nitrite: POSITIVE — AB
Protein, ur: NEGATIVE mg/dL
Specific Gravity, Urine: 1.02 (ref 1.005–1.030)
pH: 6.5 (ref 5.0–8.0)

## 2019-10-28 LAB — URINALYSIS, MICROSCOPIC (REFLEX): WBC, UA: >50 WBC/hpf (ref 0–5)

## 2019-10-28 LAB — PREGNANCY, URINE: Preg Test, Ur: NEGATIVE

## 2019-10-28 MED ORDER — CEPHALEXIN 250 MG PO CAPS
500.0000 mg | ORAL_CAPSULE | Freq: Once | ORAL | Status: AC
  Administered 2019-10-28: 500 mg via ORAL
  Filled 2019-10-28: qty 2

## 2019-10-28 MED ORDER — CEPHALEXIN 500 MG PO CAPS: 500.0000 mg | ORAL_CAPSULE | Freq: Four times a day (QID) | ORAL | 0 refills | Status: DC

## 2019-10-28 NOTE — ED Triage Notes (Signed)
She feels like she has a UTI. Dysuria x 2 weeks.

## 2019-10-28 NOTE — ED Provider Notes (Signed)
[facility] EMERGENCY DEPARTMENT Provider Note   CSN: [id number] Arrival date & time: 10/28/19  1629     History Chief Complaint  Patient presents with  . Recurrent UTI    Chelsea Joseph is a 29 y.o. female.  Patient presenting with a complaint of dysuria for about 2 weeks.  She is fairly certain she has a urinary tract infection.  She has a little bit of right CVA discomfort.  But no fevers no nausea or vomiting.  No dizziness no lightheadedness.  Blood pressure from was 96/71 heart rate 71.  Both those numbers are close together.  We will have them repeated.        Past Medical History:  Diagnosis Date  . Anxiety   . Depression     Patient Active Problem List   Diagnosis Date Noted  . Anxiety, generalized 01/06/2015  . Depression, major, recurrent, moderate (HCC) 01/06/2015  . UTI (lower urinary tract infection) 12/08/2012  . Bacterial vaginosis 12/08/2012    History reviewed. No pertinent surgical history.   OB History   No obstetric history on file.     Family History  Problem Relation Age of Onset  . Diabetes Mother   . Heart attack Mother   . Hypertension Mother   . Alcohol abuse Father   . Anxiety disorder Sister   . Panic disorder Sister     Social History   Tobacco Use  . Smoking status: Current Every Day Smoker    Packs/day: 1.00    Years: 5.00    Pack years: 5.00    Types: Cigarettes    Start date: 04/05/2005  . Smokeless tobacco: Never Used  Substance Use Topics  . Alcohol use: Yes    Alcohol/week: 0.0 - 4.0 standard drinks  . Drug use: No    Home Medications Prior to Admission medications   Medication Sig Start Date End Date Taking? Authorizing Provider  cephALEXin (KEFLEX) 500 MG capsule Take 1 capsule (500 mg total) by mouth 4 (four) times daily. 10/28/19   Vanetta Mulders, MD  dicyclomine (BENTYL) 10 MG capsule Take 1 capsule (10 mg total) by mouth 3 (three) times daily as needed for up to 14 days for spasms. or  abdominal pain 02/21/19 03/07/19  Loleta Rose, MD  levonorgestrel (MIRENA) 20 MCG/24HR IUD 1 each by Intrauterine route once. 07/15/14   [provider]  omeprazole (PRILOSEC OTC) 20 MG tablet Take 1 tablet (20 mg total) by mouth daily. 02/21/19 02/21/20  Loleta Rose, MD  ondansetron (ZOFRAN ODT) 4 MG disintegrating tablet Allow 1-2 tablets to dissolve in your mouth every 8 hours as needed for nausea/vomiting 02/21/19   Loleta Rose, MD  sucralfate (CARAFATE) 1 g tablet Take 1 tablet (1 g total) by mouth 4 (four) times daily as needed (for abdominal discomfort, nausea, and/or vomiting). 02/21/19   Loleta Rose, MD    Allergies    Vistaril [hydroxyzine hcl]  Review of Systems   Review of Systems  Constitutional: Negative for chills and fever.  HENT: Negative for congestion, rhinorrhea and sore throat.   Eyes: Negative for visual disturbance.  Respiratory: Negative for cough and shortness of breath.   Cardiovascular: Negative for chest pain and leg swelling.  Gastrointestinal: Negative for abdominal pain, diarrhea, nausea and vomiting.  Genitourinary: Positive for dysuria. Negative for hematuria.  Musculoskeletal: Negative for back pain and neck pain.  Skin: Negative for rash.  Neurological: Negative for dizziness, light-headedness and headaches.  Hematological: Does not bruise/bleed easily.  Psychiatric/Behavioral:  Negative for confusion.    Physical Exam Updated Vital Signs BP 96/71   Pulse 71   Temp 98.2 F (36.8 C) (Oral)   Resp 20   Ht 1.651 m (5\' 5" )   Wt 71.7 kg   SpO2 99%   BMI 26.29 kg/m   Physical Exam Vitals and nursing note reviewed.  Constitutional:      General: She is not in acute distress.    Appearance: Normal appearance. She is well-developed.  HENT:     Head: Normocephalic and atraumatic.  Eyes:     Extraocular Movements: Extraocular movements intact.     Conjunctiva/sclera: Conjunctivae normal.     Pupils: Pupils are equal, round, and reactive to  light.  Cardiovascular:     Rate and Rhythm: Normal rate and regular rhythm.     Heart sounds: No murmur.  Pulmonary:     Effort: Pulmonary effort is normal. No respiratory distress.     Breath sounds: Normal breath sounds.  Abdominal:     Palpations: Abdomen is soft.     Tenderness: There is no abdominal tenderness.  Musculoskeletal:        General: No swelling. Normal range of motion.     Cervical back: Neck supple.  Skin:    General: Skin is warm and dry.  Neurological:     General: No focal deficit present.     Mental Status: She is alert and oriented to person, place, and time.     ED Results / Procedures / Treatments   Labs (all labs ordered are listed, but only abnormal results are displayed) Labs Reviewed  URINALYSIS, ROUTINE W REFLEX MICROSCOPIC - Abnormal; Notable for the following components:      Result Value   APPearance CLOUDY (*)    Hgb urine dipstick TRACE (*)    Nitrite POSITIVE (*)    Leukocytes,Ua SMALL (*)    All other components within normal limits  URINALYSIS, MICROSCOPIC (REFLEX) - Abnormal; Notable for the following components:   Bacteria, UA MANY (*)    All other components within normal limits  URINE CULTURE  PREGNANCY, URINE    EKG None  Radiology No results found.  Procedures Procedures (including critical care time)  Medications Ordered in ED Medications  cephALEXin (KEFLEX) capsule 500 mg (has no administration in time range)    ED Course  I have reviewed the triage vital signs and the nursing notes.  Pertinent labs & imaging results that were available during my care of the patient were reviewed by me and considered in my medical decision making (see chart for details).    MDM Rules/Calculators/A&P                      Patient clinically nontoxic no acute distress.  Urinalysis positive nitrite consistent with the urinary tract infection.  Urine sent for culture.  Pregnancy test negative.  Will treat with 1 dose of Keflex  here and then will continue Keflex for the next 7 days.  Patient will return for any new or worse symptoms to include fevers or persistent vomiting or worse of pain.   Final Clinical Impression(s) / ED Diagnoses Final diagnoses:  Acute cystitis without hematuria    Rx / DC Orders ED Discharge Orders         Ordered    cephALEXin (KEFLEX) 500 MG capsule  4 times daily     10/28/19 1810           Srijan Givan, [place],  MD 10/28/19 1813

## 2019-10-28 NOTE — Discharge Instructions (Signed)
Urinalysis consistent with urinary tract infection.  Take the antibiotic Keflex as directed for the next 7 days.  Would expect improvement over the next 2 to 4 days.  Return for any new or worse symptoms.  To include fever persistent vomiting.  Or if not better over the next few days.

## 2019-10-28 NOTE — ED Notes (Signed)
Hx of UTI, thinks it is from IUD which she has scheduled to get taken out. Burning sensation when urinating, intermit bleeding, denies discharge.

## 2019-10-30 LAB — URINE CULTURE: Culture: >=100000 — AB

## 2020-01-21 ENCOUNTER — Encounter (HOSPITAL_COMMUNITY): Payer: Self-pay

## 2020-01-21 ENCOUNTER — Ambulatory Visit (HOSPITAL_COMMUNITY)
Admission: EM | Admit: 2020-01-21 | Discharge: 2020-01-21 | Disposition: A | Discharge status: Home / Self Care | Payer: Medicaid Other | Attending: Family Medicine | Admitting: Family Medicine

## 2020-01-21 ENCOUNTER — Other Ambulatory Visit: Payer: Self-pay

## 2020-01-21 DIAGNOSIS — Z3202 Encounter for pregnancy test, result negative: Secondary | ICD-10-CM | POA: Diagnosis not present

## 2020-01-21 DIAGNOSIS — Z113 Encounter for screening for infections with a predominantly sexual mode of transmission: Secondary | ICD-10-CM

## 2020-01-21 DIAGNOSIS — N390 Urinary tract infection, site not specified: Secondary | ICD-10-CM

## 2020-01-21 LAB — POCT URINALYSIS DIP (DEVICE)
Bilirubin Urine: NEGATIVE
Glucose, UA: NEGATIVE mg/dL
Ketones, ur: NEGATIVE mg/dL
Nitrite: POSITIVE — AB
Protein, ur: NEGATIVE mg/dL
Specific Gravity, Urine: 1.02 (ref 1.005–1.030)
Urobilinogen, UA: 0.2 mg/dL (ref 0.0–1.0)
pH: 7 (ref 5.0–8.0)

## 2020-01-21 LAB — HIV ANTIBODY (ROUTINE TESTING W REFLEX): HIV Screen 4th Generation wRfx: NONREACTIVE

## 2020-01-21 LAB — POC URINE PREG, ED: Preg Test, Ur: NEGATIVE

## 2020-01-21 MED ORDER — CEPHALEXIN 500 MG PO CAPS: 500.0000 mg | ORAL_CAPSULE | Freq: Two times a day (BID) | ORAL | 0 refills | Status: AC

## 2020-01-21 NOTE — Discharge Instructions (Signed)
Urine showed evidence of infection. We are treating you with keflex. Be sure to take full course. Stay hydrated- urine should be pale yellow to clear.    We are testing you for HIV, RPR, Gonorrhea, Chlamydia, Trichomonas, Yeast and Bacterial Vaginosis. We will call you if anything is positive and let you know if you require any further treatment. Please inform partners of any positive results.   Please return if symptoms not improving with treatment, development of fever, nausea, vomiting, abdominal pain.

## 2020-01-21 NOTE — ED Provider Notes (Signed)
[facility]    CSN: [id number] Arrival date & time: 01/21/20  1315      History   Chief Complaint Chief Complaint  Patient presents with  . Urinary Tract Infection    HPI Chelsea Joseph is a 29 y.o. female no significant past medical history presenting today for evaluation from possible UTI and STD screening.  Patient reports over the past 3 weeks she has had foul odor with urination as well as dysuria.  She denies urinary frequency.  Denies vaginal discharge.  Denies fevers, nausea, vomiting, abdominal pain or back pain.  She does express high suspicion over possible STD exposure she reports that her partner has had some penile discharge.  Has had prior BV infections, but symptoms feel different.  HPI  Past Medical History:  Diagnosis Date  . Anxiety   . Depression     Patient Active Problem List   Diagnosis Date Noted  . Anxiety, generalized 01/06/2015  . Depression, major, recurrent, moderate (HCC) 01/06/2015  . UTI (lower urinary tract infection) 12/08/2012  . Bacterial vaginosis 12/08/2012    History reviewed. No pertinent surgical history.  OB History   No obstetric history on file.      Home Medications    Prior to Admission medications   Medication Sig Start Date End Date Taking? Authorizing Provider  cephALEXin (KEFLEX) 500 MG capsule Take 1 capsule (500 mg total) by mouth 2 (two) times daily for 5 days. 01/21/20 01/26/20  Bernedette Auston C, PA-C  levonorgestrel (MIRENA) 20 MCG/24HR IUD 1 each by Intrauterine route once. 07/15/14   [provider]  dicyclomine (BENTYL) 10 MG capsule Take 1 capsule (10 mg total) by mouth 3 (three) times daily as needed for up to 14 days for spasms. or abdominal pain 02/21/19 01/21/20  Loleta Rose, MD  omeprazole (PRILOSEC OTC) 20 MG tablet Take 1 tablet (20 mg total) by mouth daily. 02/21/19 01/21/20  Loleta Rose, MD  sucralfate (CARAFATE) 1 g tablet Take 1 tablet (1 g total) by mouth 4 (four) times daily as  needed (for abdominal discomfort, nausea, and/or vomiting). 02/21/19 01/21/20  Loleta Rose, MD    Family History Family History  Problem Relation Age of Onset  . Diabetes Mother   . Heart attack Mother   . Hypertension Mother   . Alcohol abuse Father   . Anxiety disorder Sister   . Panic disorder Sister     Social History Social History   Tobacco Use  . Smoking status: Current Every Day Smoker    Packs/day: 1.00    Years: 5.00    Pack years: 5.00    Types: Cigarettes    Start date: 04/05/2005  . Smokeless tobacco: Never Used  Vaping Use  . Vaping Use: Never used  Substance Use Topics  . Alcohol use: Yes    Alcohol/week: 6.0 standard drinks    Types: 6 Shots of liquor per week  . Drug use: No     Allergies   Vistaril [hydroxyzine hcl]   Review of Systems Review of Systems  Constitutional: Negative for fever.  Respiratory: Negative for shortness of breath.   Cardiovascular: Negative for chest pain.  Gastrointestinal: Negative for abdominal pain, diarrhea, nausea and vomiting.  Genitourinary: Positive for dysuria. Negative for flank pain, genital sores, hematuria, menstrual problem, vaginal bleeding, vaginal discharge and vaginal pain.  Musculoskeletal: Negative for back pain.  Skin: Negative for rash.  Neurological: Negative for dizziness, light-headedness and headaches.     Physical Exam Triage Vital  Signs ED Triage Vitals  Enc Vitals Group     BP 01/21/20 1405 104/71     Pulse Rate 01/21/20 1405 81     Resp 01/21/20 1405 16     Temp 01/21/20 1405 98.7 F (37.1 C)     Temp Source 01/21/20 1405 Oral     SpO2 01/21/20 1405 99 %     Weight 01/21/20 1406 160 lb (72.6 kg)     Height 01/21/20 1406 5\' 5"  (1.651 m)     Head Circumference --      Peak Flow --      Pain Score 01/21/20 1406 0     Pain Loc --      Pain Edu? --      Excl. in GC? --    No data found.  Updated Vital Signs BP 104/71   Pulse 81   Temp 98.7 F (37.1 C) (Oral)   Resp 16   Ht  5\' 5"  (1.651 m)   Wt 160 lb (72.6 kg)   SpO2 99%   BMI 26.63 kg/m   Visual Acuity Right Eye Distance:   Left Eye Distance:   Bilateral Distance:    Right Eye Near:   Left Eye Near:    Bilateral Near:     Physical Exam Vitals and nursing note reviewed.  Constitutional:      Appearance: She is well-developed.     Comments: No acute distress  HENT:     Head: Normocephalic and atraumatic.     Nose: Nose normal.  Eyes:     Conjunctiva/sclera: Conjunctivae normal.  Cardiovascular:     Rate and Rhythm: Normal rate.  Pulmonary:     Effort: Pulmonary effort is normal. No respiratory distress.  Abdominal:     General: There is no distension.  Musculoskeletal:        General: Normal range of motion.     Cervical back: Neck supple.  Skin:    General: Skin is warm and dry.  Neurological:     Mental Status: She is alert and oriented to person, place, and time.      UC Treatments / Results  Labs (all labs ordered are listed, but only abnormal results are displayed) Labs Reviewed  POCT URINALYSIS DIP (DEVICE) - Abnormal; Notable for the following components:      Result Value   Hgb urine dipstick TRACE (*)    Nitrite POSITIVE (*)    Leukocytes,Ua SMALL (*)    All other components within normal limits  URINE CULTURE  HIV ANTIBODY (ROUTINE TESTING W REFLEX)  RPR  POC URINE PREG, ED  CERVICOVAGINAL ANCILLARY ONLY    EKG   Radiology No results found.  Procedures Procedures (including critical care time)  Medications Ordered in UC Medications - No data to display  Initial Impression / Assessment and Plan / UC Course  I have reviewed the triage vital signs and the nursing notes.  Pertinent labs & imaging results that were available during my care of the patient were reviewed by me and considered in my medical decision making (see chart for details).     Pregnancy test negative, UA with positive nitrites, small leuks, will go ahead and empirically treat for UTI  today and send off urine culture.  Treating with Keflex twice daily x5 days.  Push fluids.  Also screening for STDs.  Will call with results and alter treatment as needed.  Discussed strict return precautions. Patient verbalized understanding and is agreeable with plan.  Final Clinical Impressions(s) / UC Diagnoses   Final diagnoses:  Lower urinary tract infectious disease  Screen for STD (sexually transmitted disease)     Discharge Instructions     Urine showed evidence of infection. We are treating you with keflex. Be sure to take full course. Stay hydrated- urine should be pale yellow to clear.    We are testing you for HIV, RPR, Gonorrhea, Chlamydia, Trichomonas, Yeast and Bacterial Vaginosis. We will call you if anything is positive and let you know if you require any further treatment. Please inform partners of any positive results.   Please return if symptoms not improving with treatment, development of fever, nausea, vomiting, abdominal pain.     ED Prescriptions    Medication Sig Dispense Auth. Provider   cephALEXin (KEFLEX) 500 MG capsule Take 1 capsule (500 mg total) by mouth 2 (two) times daily for 5 days. 10 capsule Amily Depp, [place] C, PA-C     PDMP not reviewed this encounter.   Sharyon Cable [place] C, PA-C 01/21/20 1507

## 2020-01-21 NOTE — ED Triage Notes (Signed)
Pt c/o foul odor from urinex3wks. STI testing. Pt denies STI symptoms at this time.

## 2020-01-22 LAB — RPR: RPR Ser Ql: NONREACTIVE

## 2020-01-23 LAB — URINE CULTURE: Culture: >=100000 — AB

## 2020-01-25 LAB — CERVICOVAGINAL ANCILLARY ONLY
Bacterial Vaginitis (gardnerella): POSITIVE — AB
Candida Glabrata: NEGATIVE
Candida Vaginitis: NEGATIVE
Chlamydia: NEGATIVE
Comment: NEGATIVE
Comment: NEGATIVE
Comment: NEGATIVE
Comment: NEGATIVE
Comment: NEGATIVE
Comment: NORMAL
Neisseria Gonorrhea: NEGATIVE
Trichomonas: NEGATIVE

## 2020-01-29 ENCOUNTER — Telehealth: Payer: Self-pay | Admitting: Emergency Medicine

## 2020-01-29 MED ORDER — METRONIDAZOLE 500 MG PO TABS: 500.0000 mg | ORAL_TABLET | Freq: Two times a day (BID) | ORAL | 0 refills | Status: DC

## 2020-06-19 IMAGING — US ULTRASOUND ABDOMEN LIMITED
1 series · 14 of 25 positions shown · non-contrast
Comparison: None.

CLINICAL DATA: Epigastric pain with nausea and vomiting

EXAM:
ULTRASOUND ABDOMEN LIMITED RIGHT UPPER QUADRANT

[Series 1: ultrasound abdomen limited · 14 of 49 slices shown]
[im 1/49]
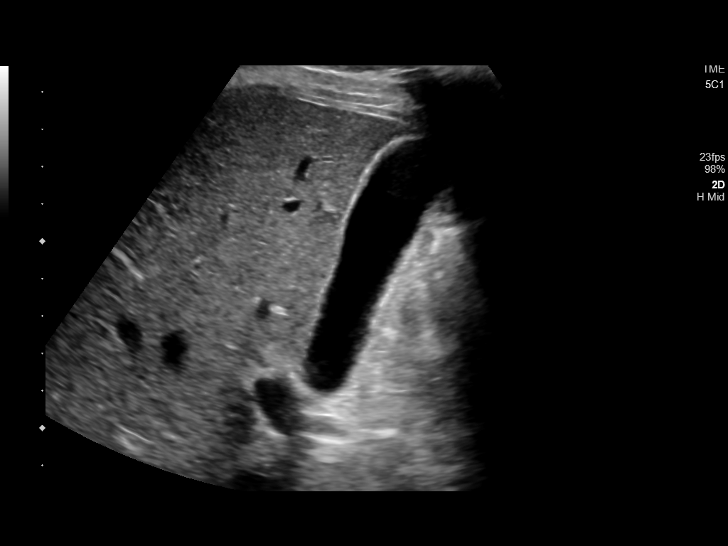
[im 5/49]
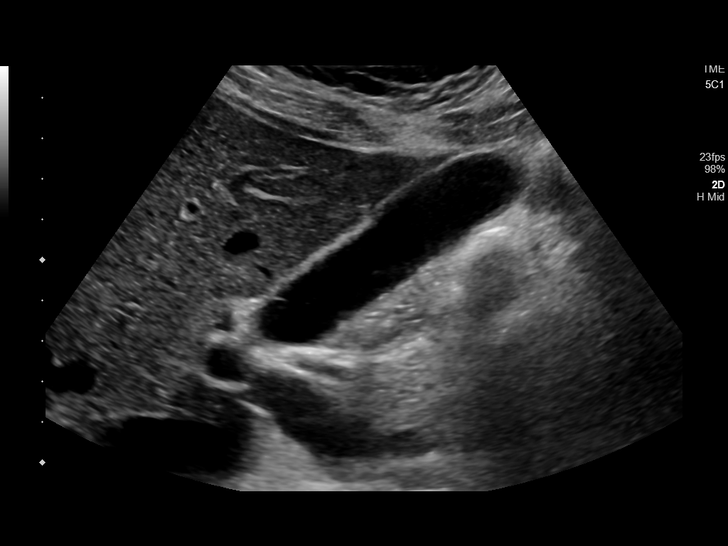
[im 9/49]
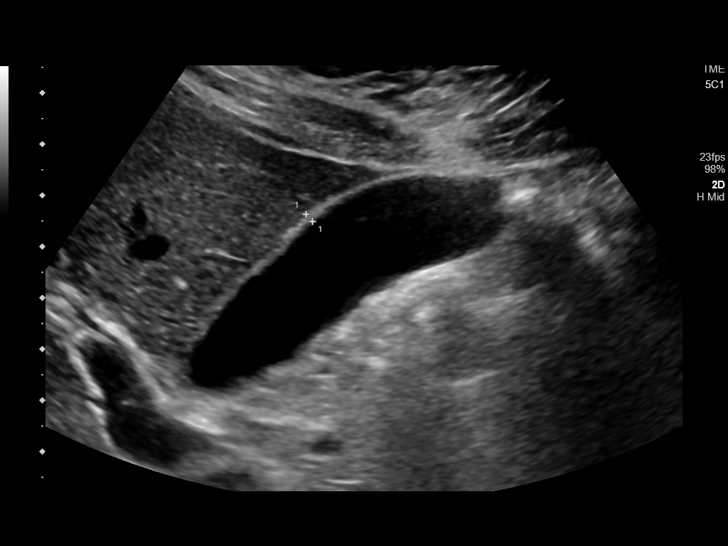
[im 13/49]
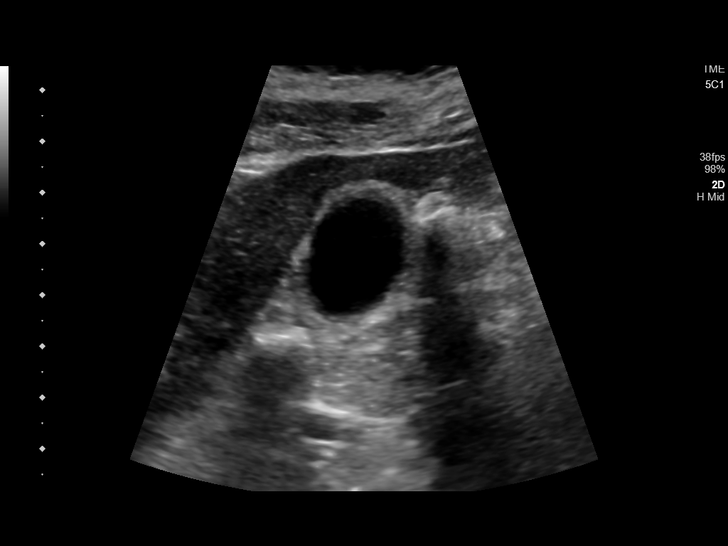
[im 17/49]
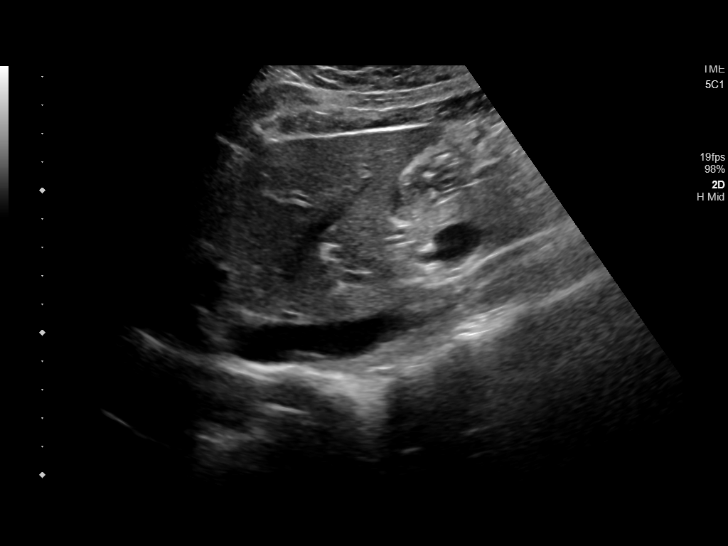
[im 19/49]
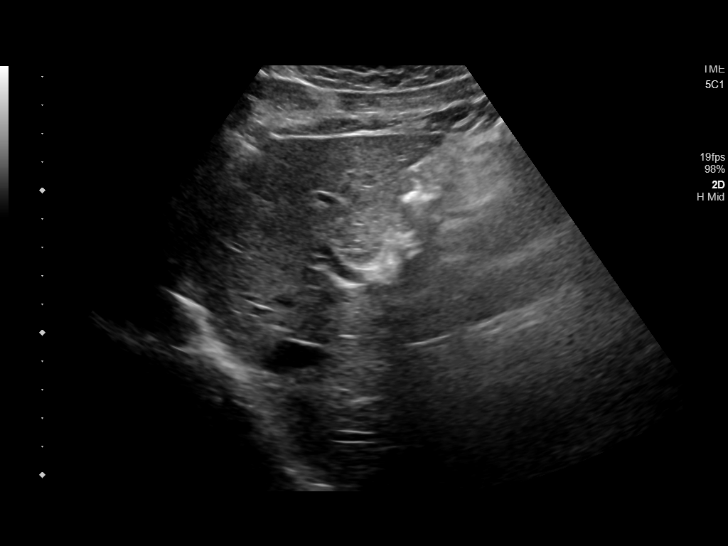
[im 23/49]
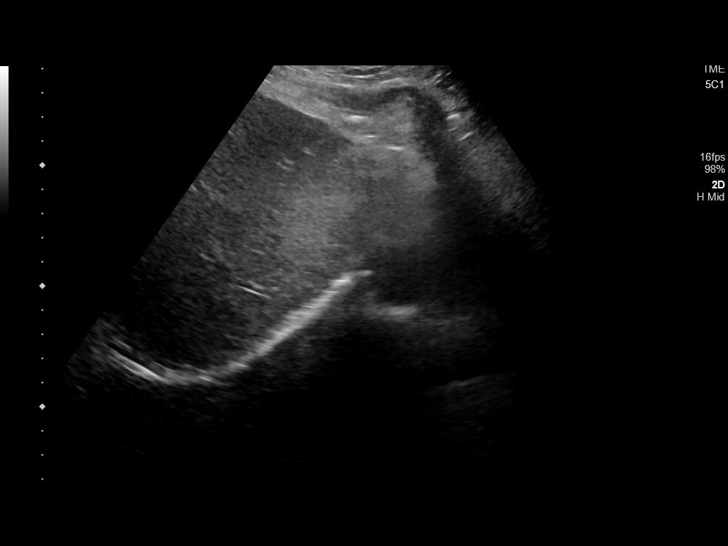
[im 27/49]
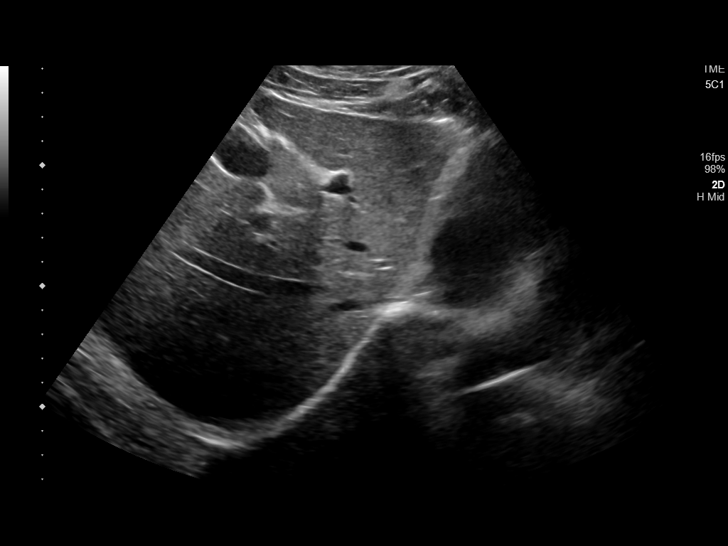
[im 31/49]
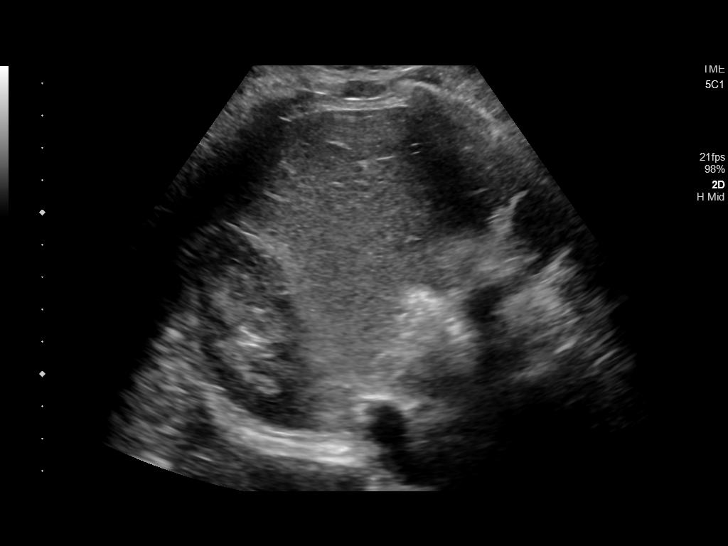
[im 33/49]
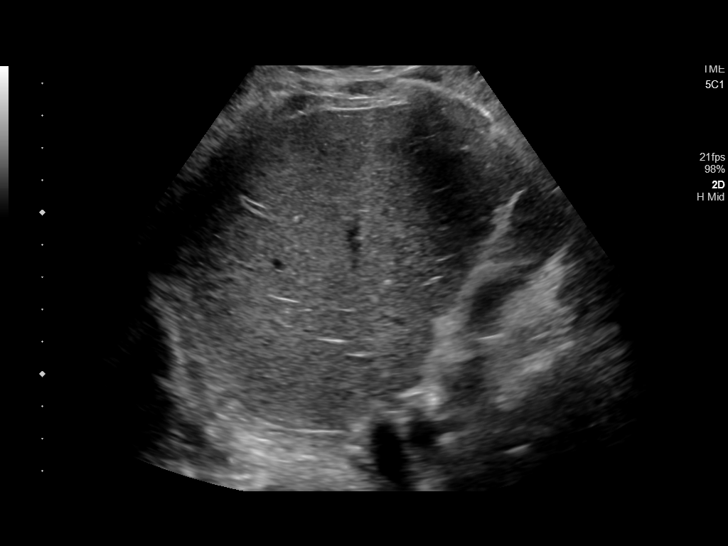
[im 37/49]
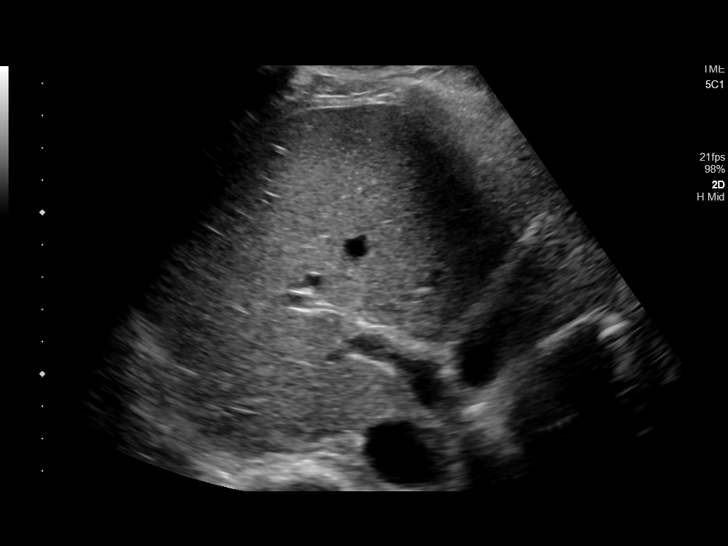
[im 41/49]
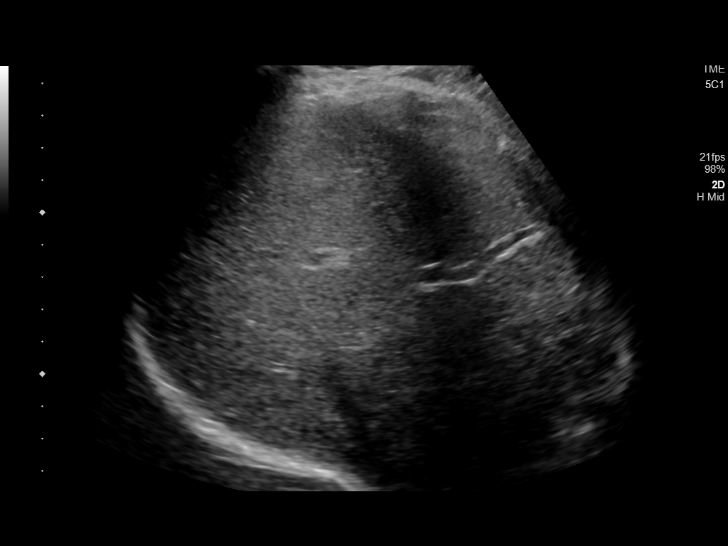
[im 45/49]
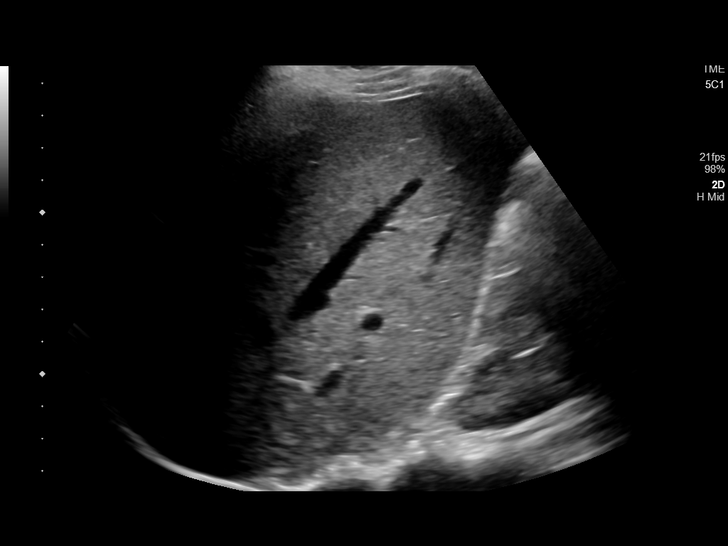
[im 49/49]
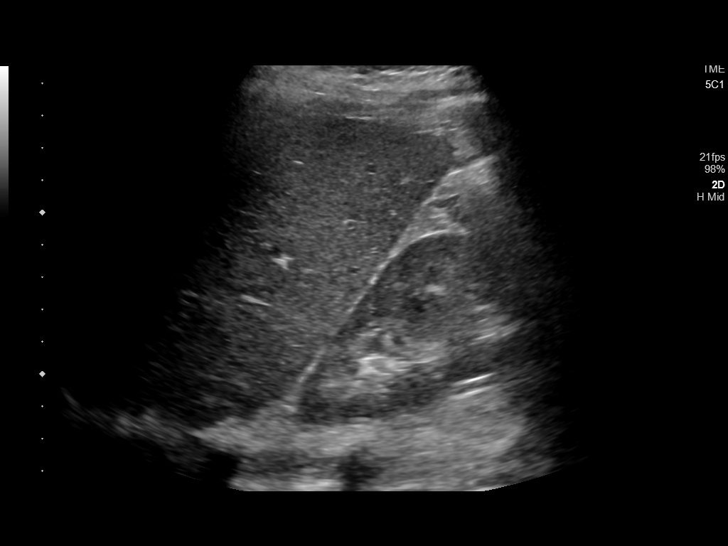

[14 of 25 positions shown; findings below may reference images not displayed]

FINDINGS: Gallbladder:

No gallstones or wall thickening visualized. No sonographic Murphy
sign noted by sonographer.

Common bile duct:

Diameter: 3 mm

Liver:

No focal lesion identified. Within normal limits in parenchymal
echogenicity. Portal vein is patent on color Doppler imaging with
normal direction of blood flow towards the liver.

Other: None.
IMPRESSION: Normal right upper quadrant ultrasound.

## 2021-06-15 NOTE — Telephone Encounter (Signed)
NO SHOW

## 2022-07-15 NOTE — L&D Delivery Note (Cosign Needed Addendum)
LABOR COURSE Chelsea Joseph is a 32 y.o. G4P3003 at 108w0d presented for IOL for post dates. S/P two doses of dual cytotec. FB attempted but failed. Epidural placed. Pitocin augmentation. Patient progressed to complete.   Delivery Note Called to room and patient was complete and pushing at 2000. Head delivered OA. No nuchal cord present. Shoulder and body delivered in usual fashion. At 2015 a viable female infant was delivered via Vaginal, Spontaneous (Presentation:OA;ROA ).  Infant with spontaneous cry, placed on mother's abdomen, dried and stimulated. Cord clamped x 2 after 1-minute delay, and cut by FOB. Cord blood drawn. Placenta delivered spontaneously with gentle cord traction. Appears intact. Fundus firm with massage and Pitocin. Labia, perineum, vagina, and cervix inspected with no lacerations noted.   APGAR: 8,9; weight  pending  Cord: 3VC with no complications.   Anesthesia: Epidural.  Episiotomy: None Lacerations: None Est. Blood Loss (mL): 110  Mom to postpartum.  Baby to Couplet care / Skin to Skin.  Cashion, Cyndra Numbers, MD 8:26 PM    GME ATTESTATION:  Evaluation and management procedures were performed by the [facility] Medicine Resident under my supervision. I was immediately available and gloved for direct supervision, assistance and direction throughout this encounter.  I also confirm that I have verified the information documented in the resident's note, and that I have also personally reperformed the pertinent components of the physical exam and all of the medical decision making activities.  I have also made any necessary editorial changes.  Wyn Forster, MD OB Fellow, Faculty Practice [facility] Center for [facility] 03/29/2023 8:51 PM

## 2023-03-28 ENCOUNTER — Other Ambulatory Visit: Payer: Self-pay

## 2023-03-28 ENCOUNTER — Inpatient Hospital Stay (EMERGENCY_DEPARTMENT_HOSPITAL)
Admission: AD | Admit: 2023-03-28 | Discharge: 2023-03-28 | Disposition: A | Discharge status: Home / Self Care | Payer: Medicaid Other | Source: Home / Self Care | Attending: Obstetrics and Gynecology | Admitting: Obstetrics and Gynecology

## 2023-03-28 ENCOUNTER — Encounter (HOSPITAL_COMMUNITY): Payer: Self-pay | Admitting: Obstetrics and Gynecology

## 2023-03-28 DIAGNOSIS — F17210 Nicotine dependence, cigarettes, uncomplicated: Secondary | ICD-10-CM | POA: Insufficient documentation

## 2023-03-28 DIAGNOSIS — Z3A40 40 weeks gestation of pregnancy: Secondary | ICD-10-CM | POA: Insufficient documentation

## 2023-03-28 DIAGNOSIS — O99333 Smoking (tobacco) complicating pregnancy, third trimester: Secondary | ICD-10-CM | POA: Insufficient documentation

## 2023-03-28 DIAGNOSIS — Z0371 Encounter for suspected problem with amniotic cavity and membrane ruled out: Secondary | ICD-10-CM | POA: Insufficient documentation

## 2023-03-28 LAB — POCT FERN TEST

## 2023-03-28 LAB — GLUCOSE, CAPILLARY: Glucose-Capillary: 113 mg/dL — ABNORMAL HIGH (ref 70–99)

## 2023-03-28 LAB — GROUP B STREP BY PCR: Group B strep by PCR: NEGATIVE

## 2023-03-28 NOTE — MAU Provider Note (Signed)
Obstetric Attending MAU Note  Chief Complaint:  Rupture of Membranes   Event Date/Time   First Provider Initiated Contact with Patient 03/28/23 1655     HPI: Chelsea Joseph is a 32 y.o. G4P3003 at [redacted]w[redacted]d who presents to maternity admissions reporting Leaking fluid x 3 days or lost her mucous plus. Denies contractions or vaginal bleeding. Good fetal movement.   Pregnancy Course: Receives care at [facility] none since 26 weeks  Patient Active Problem List   Diagnosis Date Noted   Anxiety, generalized 01/06/2015   Depression, major, recurrent, moderate (HCC) 01/06/2015   UTI (lower urinary tract infection) 12/08/2012   Bacterial vaginosis 12/08/2012    Past Medical History:  Diagnosis Date   Anxiety    Depression     OB History  Gravida Para Term Preterm AB Living  4 3 3     3   SAB IAB Ectopic Multiple Live Births          3    # Outcome Date GA Lbr Len/2nd Weight Sex Type Anes PTL Lv  4 Current           3 Term 06/13/21    F Vag-Spont   LIV  2 Term 09/16/14    M Vag-Spont   LIV  1 Term 10/01/10    M Vag-Spont   LIV    History reviewed. No pertinent surgical history.  Family History: Family History  Problem Relation Age of Onset   Diabetes Mother    Heart attack Mother    Hypertension Mother    Alcohol abuse Father    Anxiety disorder Sister    Panic disorder Sister     Social History: Social History   Tobacco Use   Smoking status: Every Day    Current packs/day: 1.00    Average packs/day: 1 pack/day for 18.0 years (18.0 ttl pk-yrs)    Types: Cigarettes    Start date: 04/05/2005   Smokeless tobacco: Never  Vaping Use   Vaping status: Never Used  Substance Use Topics   Alcohol use: Not Currently    Alcohol/week: 6.0 standard drinks of alcohol    Types: 6 Shots of liquor per week   Drug use: No    Allergies:  Allergies  Allergen Reactions   Sulfa Antibiotics Hives and Shortness Of Breath    Medications Prior to Admission  Medication Sig Dispense  Refill Last Dose   levonorgestrel (MIRENA) 20 MCG/24HR IUD 1 each by Intrauterine route once.      Prenatal Vit-Fe Fumarate-FA (PRENATAL MULTIVITAMIN) TABS tablet Take 1 tablet by mouth daily at 12 noon.   More than a month    ROS: Pertinent findings in history of present illness.  Physical Exam  Temperature 98.5 F (36.9 C), temperature source Oral, resp. rate 20, last menstrual period 06/29/2022. CONSTITUTIONAL: Well-developed, well-nourished female in no acute distress.  HENT:  Normocephalic, atraumatic, External right and left ear normal. Oropharynx is clear and moist EYES: Conjunctivae and EOM are normal. No scleral icterus.  NECK: Normal range of motion, supple, no masses SKIN: Skin is warm and dry. No rash noted. Not diaphoretic. No erythema. No pallor. NEUROLGIC: Alert and oriented to person, place, and time.  PSYCHIATRIC: Normal mood and affect. Normal behavior. Normal judgment and thought content. CARDIOVASCULAR: Normal heart rate noted, regular rhythm RESPIRATORY: Effort and breath sounds normal, no problems with respiration noted ABDOMEN: Soft, nontender, nondistended, gravid appropriate for gestational age MUSCULOSKELETAL: Normal range of motion. No edema and no tenderness. 2+ distal pulses.  SPECULUM EXAM: NEFG, physiologic discharge, no blood, cervix clean, no pooling Dilation: 1 Effacement (%): Thick Station: -3 Presentation: Vertex Exam by:: Dr Shawnie Pons  FHT:  Baseline 125 , moderate variability, accelerations present, no decelerations Contractions: irregular    Labs: Results for orders placed or performed during the hospital encounter of 03/28/23 (from the past 24 hour(s))  Fern Test     Status: Normal   Collection Time: 03/28/23  5:22 PM  Result Value Ref Range   POCT Fern Test    Glucose, capillary     Status: Abnormal   Collection Time: 03/28/23  5:34 PM  Result Value Ref Range   Glucose-Capillary 113 (H) 70 - 99 mg/dL    Imaging:  No results  found.  MAU Course: Ferning negative Collected rapid GBS  Assessment: 1. [redacted] weeks gestation of pregnancy   2. Encounter for suspected premature rupture of amniotic membranes, with rupture of membranes not found     Plan: Discharge home Labor precautions and fetal kick counts reviewed IOL scheduled for tonight at midnight, due to no spots after 41 weeks.    Allergies as of 03/28/2023       Reactions   Sulfa Antibiotics Hives, Shortness Of Breath        Medication List     STOP taking these medications    levonorgestrel 20 MCG/24HR IUD Commonly known as: MIRENA       TAKE these medications    prenatal multivitamin Tabs tablet Take 1 tablet by mouth daily at 12 noon.        Reva Bores, MD 03/28/2023 5:38 PM

## 2023-03-29 ENCOUNTER — Inpatient Hospital Stay (HOSPITAL_COMMUNITY): Payer: Medicaid Other

## 2023-03-29 ENCOUNTER — Encounter (HOSPITAL_COMMUNITY): Payer: Self-pay | Admitting: Obstetrics & Gynecology

## 2023-03-29 ENCOUNTER — Inpatient Hospital Stay (HOSPITAL_COMMUNITY): Payer: Medicaid Other | Admitting: Anesthesiology

## 2023-03-29 ENCOUNTER — Inpatient Hospital Stay (HOSPITAL_COMMUNITY)
Admission: AD | Admit: 2023-03-29 | Discharge: 2023-03-31 | DRG: 807 | Disposition: A | Discharge status: Home / Self Care | Payer: Medicaid Other | Attending: Obstetrics and Gynecology | Admitting: Obstetrics & Gynecology

## 2023-03-29 DIAGNOSIS — F17210 Nicotine dependence, cigarettes, uncomplicated: Secondary | ICD-10-CM | POA: Diagnosis present

## 2023-03-29 DIAGNOSIS — O99334 Smoking (tobacco) complicating childbirth: Secondary | ICD-10-CM | POA: Diagnosis present

## 2023-03-29 DIAGNOSIS — Z3490 Encounter for supervision of normal pregnancy, unspecified, unspecified trimester: Principal | ICD-10-CM | POA: Diagnosis present

## 2023-03-29 DIAGNOSIS — Z3A40 40 weeks gestation of pregnancy: Secondary | ICD-10-CM | POA: Diagnosis not present

## 2023-03-29 DIAGNOSIS — Z3A39 39 weeks gestation of pregnancy: Secondary | ICD-10-CM | POA: Diagnosis not present

## 2023-03-29 DIAGNOSIS — O480 Post-term pregnancy: Principal | ICD-10-CM | POA: Diagnosis present

## 2023-03-29 DIAGNOSIS — Z0371 Encounter for suspected problem with amniotic cavity and membrane ruled out: Secondary | ICD-10-CM

## 2023-03-29 DIAGNOSIS — Z23 Encounter for immunization: Secondary | ICD-10-CM | POA: Diagnosis not present

## 2023-03-29 DIAGNOSIS — Z833 Family history of diabetes mellitus: Secondary | ICD-10-CM | POA: Diagnosis not present

## 2023-03-29 DIAGNOSIS — O24420 Gestational diabetes mellitus in childbirth, diet controlled: Secondary | ICD-10-CM

## 2023-03-29 DIAGNOSIS — O99344 Other mental disorders complicating childbirth: Secondary | ICD-10-CM | POA: Diagnosis present

## 2023-03-29 DIAGNOSIS — Z818 Family history of other mental and behavioral disorders: Secondary | ICD-10-CM

## 2023-03-29 DIAGNOSIS — F419 Anxiety disorder, unspecified: Secondary | ICD-10-CM | POA: Diagnosis present

## 2023-03-29 DIAGNOSIS — Z8249 Family history of ischemic heart disease and other diseases of the circulatory system: Secondary | ICD-10-CM | POA: Diagnosis not present

## 2023-03-29 DIAGNOSIS — F32A Depression, unspecified: Secondary | ICD-10-CM | POA: Diagnosis present

## 2023-03-29 LAB — CBC
HCT: 32.8 % — ABNORMAL LOW (ref 36.0–46.0)
Hemoglobin: 10.8 g/dL — ABNORMAL LOW (ref 12.0–15.0)
MCH: 29.8 pg (ref 26.0–34.0)
MCHC: 32.9 g/dL (ref 30.0–36.0)
MCV: 90.6 fL (ref 80.0–100.0)
Platelets: 203 10*3/uL (ref 150–400)
RBC: 3.62 MIL/uL — ABNORMAL LOW (ref 3.87–5.11)
RDW: 13.8 % (ref 11.5–15.5)
WBC: 10.6 10*3/uL — ABNORMAL HIGH (ref 4.0–10.5)
nRBC: 0 % (ref 0.0–0.2)

## 2023-03-29 LAB — GLUCOSE, CAPILLARY
Glucose-Capillary: 101 mg/dL — ABNORMAL HIGH (ref 70–99)
Glucose-Capillary: 124 mg/dL — ABNORMAL HIGH (ref 70–99)
Glucose-Capillary: 102 mg/dL — ABNORMAL HIGH (ref 70–99)

## 2023-03-29 LAB — TYPE AND SCREEN
ABO/RH(D): AB NEG
Antibody Screen: NEGATIVE

## 2023-03-29 LAB — RPR: RPR Ser Ql: NONREACTIVE

## 2023-03-29 MED ORDER — ONDANSETRON HCL 4 MG PO TABS: 4.0000 mg | ORAL_TABLET | ORAL | Status: DC | PRN

## 2023-03-29 MED ORDER — OXYTOCIN-SODIUM CHLORIDE 30-0.9 UT/500ML-% IV SOLN: 2.5000 [IU]/h | INTRAVENOUS | Status: DC

## 2023-03-29 MED ORDER — ACETAMINOPHEN 325 MG PO TABS
650.0000 mg | ORAL_TABLET | ORAL | Status: DC | PRN
  Administered 2023-03-30 – 2023-03-31 (×4): 650 mg via ORAL
  Filled 2023-03-29 (×4): qty 2

## 2023-03-29 MED ORDER — TETANUS-DIPHTH-ACELL PERTUSSIS 5-2.5-18.5 LF-MCG/0.5 IM SUSY: 0.5000 mL | PREFILLED_SYRINGE | Freq: Once | INTRAMUSCULAR | Status: DC

## 2023-03-29 MED ORDER — IBUPROFEN 600 MG PO TABS
600.0000 mg | ORAL_TABLET | Freq: Four times a day (QID) | ORAL | Status: DC
  Administered 2023-03-29 – 2023-03-31 (×7): 600 mg via ORAL
  Filled 2023-03-29 (×7): qty 1

## 2023-03-29 MED ORDER — SIMETHICONE 80 MG PO CHEW: 80.0000 mg | CHEWABLE_TABLET | ORAL | Status: DC | PRN

## 2023-03-29 MED ORDER — LACTATED RINGERS IV SOLN: INTRAVENOUS | Status: DC

## 2023-03-29 MED ORDER — PRENATAL MULTIVITAMIN CH
1.0000 | ORAL_TABLET | Freq: Every day | ORAL | Status: DC
  Administered 2023-03-30: 1 via ORAL

## 2023-03-29 MED ORDER — BENZOCAINE-MENTHOL 20-0.5 % EX AERO: 1.0000 | INHALATION_SPRAY | CUTANEOUS | Status: DC | PRN

## 2023-03-29 MED ORDER — ACETAMINOPHEN 325 MG PO TABS: 650.0000 mg | ORAL_TABLET | ORAL | Status: DC | PRN

## 2023-03-29 MED ORDER — COCONUT OIL OIL: 1.0000 | TOPICAL_OIL | Status: DC | PRN

## 2023-03-29 MED ORDER — PRENATAL MULTIVITAMIN CH
1.0000 | ORAL_TABLET | Freq: Every day | ORAL | Status: DC
  Administered 2023-03-31: 1 via ORAL
  Filled 2023-03-29 (×2): qty 1

## 2023-03-29 MED ORDER — EPHEDRINE 5 MG/ML INJ: 10.0000 mg | INTRAVENOUS | Status: DC | PRN

## 2023-03-29 MED ORDER — TERBUTALINE SULFATE 1 MG/ML IJ SOLN: 0.2500 mg | Freq: Once | INTRAMUSCULAR | Status: DC | PRN

## 2023-03-29 MED ORDER — SOD CITRATE-CITRIC ACID 500-334 MG/5ML PO SOLN: 30.0000 mL | ORAL | Status: DC | PRN

## 2023-03-29 MED ORDER — LACTATED RINGERS IV SOLN
500.0000 mL | INTRAVENOUS | Status: DC | PRN
  Administered 2023-03-29: 500 mL via INTRAVENOUS

## 2023-03-29 MED ORDER — OXYCODONE-ACETAMINOPHEN 5-325 MG PO TABS: 2.0000 | ORAL_TABLET | ORAL | Status: DC | PRN

## 2023-03-29 MED ORDER — MISOPROSTOL 25 MCG QUARTER TABLET
25.0000 ug | ORAL_TABLET | Freq: Once | ORAL | Status: AC
  Administered 2023-03-29: 25 ug via VAGINAL
  Filled 2023-03-29: qty 1

## 2023-03-29 MED ORDER — OXYTOCIN BOLUS FROM INFUSION
333.0000 mL | Freq: Once | INTRAVENOUS | Status: AC
  Administered 2023-03-29: 333 mL via INTRAVENOUS

## 2023-03-29 MED ORDER — OXYTOCIN-SODIUM CHLORIDE 30-0.9 UT/500ML-% IV SOLN
1.0000 m[IU]/min | INTRAVENOUS | Status: DC
  Administered 2023-03-29: 2 m[IU]/min via INTRAVENOUS
  Filled 2023-03-29: qty 500

## 2023-03-29 MED ORDER — EPHEDRINE 5 MG/ML INJ
10.0000 mg | INTRAVENOUS | Status: DC | PRN
  Filled 2023-03-29: qty 5

## 2023-03-29 MED ORDER — ZOLPIDEM TARTRATE 5 MG PO TABS: 5.0000 mg | ORAL_TABLET | Freq: Every evening | ORAL | Status: DC | PRN

## 2023-03-29 MED ORDER — MEASLES, MUMPS & RUBELLA VAC IJ SOLR: 0.5000 mL | Freq: Once | INTRAMUSCULAR | Status: DC

## 2023-03-29 MED ORDER — FLEET ENEMA RE ENEM: 1.0000 | ENEMA | RECTAL | Status: DC | PRN

## 2023-03-29 MED ORDER — MISOPROSTOL 50MCG HALF TABLET
50.0000 ug | ORAL_TABLET | Freq: Once | ORAL | Status: AC
  Administered 2023-03-29: 50 ug via ORAL
  Filled 2023-03-29: qty 1

## 2023-03-29 MED ORDER — FENTANYL-BUPIVACAINE-NACL 0.5-0.125-0.9 MG/250ML-% EP SOLN
12.0000 mL/h | EPIDURAL | Status: DC | PRN
  Administered 2023-03-29: 12 mL/h via EPIDURAL
  Filled 2023-03-29: qty 250

## 2023-03-29 MED ORDER — DIBUCAINE (PERIANAL) 1 % EX OINT: 1.0000 | TOPICAL_OINTMENT | CUTANEOUS | Status: DC | PRN

## 2023-03-29 MED ORDER — PHENYLEPHRINE 80 MCG/ML (10ML) SYRINGE FOR IV PUSH (FOR BLOOD PRESSURE SUPPORT)
80.0000 ug | PREFILLED_SYRINGE | INTRAVENOUS | Status: DC | PRN
  Filled 2023-03-29: qty 10

## 2023-03-29 MED ORDER — ONDANSETRON HCL 4 MG/2ML IJ SOLN: 4.0000 mg | Freq: Four times a day (QID) | INTRAMUSCULAR | Status: DC | PRN

## 2023-03-29 MED ORDER — LIDOCAINE-EPINEPHRINE (PF) 1.5 %-1:200000 IJ SOLN
INTRAMUSCULAR | Status: DC | PRN
Start: 2023-03-29 — End: 2023-03-29
  Administered 2023-03-29: 5 mL via EPIDURAL

## 2023-03-29 MED ORDER — ONDANSETRON HCL 4 MG/2ML IJ SOLN: 4.0000 mg | INTRAMUSCULAR | Status: DC | PRN

## 2023-03-29 MED ORDER — LACTATED RINGERS IV SOLN
500.0000 mL | Freq: Once | INTRAVENOUS | Status: AC
  Administered 2023-03-29: 500 mL via INTRAVENOUS

## 2023-03-29 MED ORDER — SENNOSIDES-DOCUSATE SODIUM 8.6-50 MG PO TABS
2.0000 | ORAL_TABLET | ORAL | Status: DC
  Administered 2023-03-29 – 2023-03-30 (×2): 2 via ORAL
  Filled 2023-03-29 (×2): qty 2

## 2023-03-29 MED ORDER — FENTANYL CITRATE (PF) 100 MCG/2ML IJ SOLN
50.0000 ug | INTRAMUSCULAR | Status: DC | PRN
  Administered 2023-03-29: 100 ug via INTRAVENOUS
  Filled 2023-03-29: qty 2

## 2023-03-29 MED ORDER — WITCH HAZEL-GLYCERIN EX PADS: 1.0000 | MEDICATED_PAD | CUTANEOUS | Status: DC | PRN

## 2023-03-29 MED ORDER — OXYCODONE-ACETAMINOPHEN 5-325 MG PO TABS: 1.0000 | ORAL_TABLET | ORAL | Status: DC | PRN

## 2023-03-29 MED ORDER — DIPHENHYDRAMINE HCL 25 MG PO CAPS: 25.0000 mg | ORAL_CAPSULE | Freq: Four times a day (QID) | ORAL | Status: DC | PRN

## 2023-03-29 MED ORDER — DOCUSATE SODIUM 100 MG PO CAPS
100.0000 mg | ORAL_CAPSULE | Freq: Two times a day (BID) | ORAL | Status: DC
  Administered 2023-03-29 – 2023-03-31 (×4): 100 mg via ORAL
  Filled 2023-03-29 (×4): qty 1

## 2023-03-29 MED ORDER — PHENYLEPHRINE 80 MCG/ML (10ML) SYRINGE FOR IV PUSH (FOR BLOOD PRESSURE SUPPORT): 80.0000 ug | PREFILLED_SYRINGE | INTRAVENOUS | Status: DC | PRN

## 2023-03-29 MED ORDER — DIPHENHYDRAMINE HCL 50 MG/ML IJ SOLN: 12.5000 mg | INTRAMUSCULAR | Status: DC | PRN

## 2023-03-29 MED ORDER — LIDOCAINE HCL (PF) 1 % IJ SOLN: 30.0000 mL | INTRAMUSCULAR | Status: DC | PRN

## 2023-03-29 NOTE — H&P (Signed)
OBSTETRIC ADMISSION HISTORY AND PHYSICAL  Chelsea Joseph is a 32 y.o. female [phone] with IUP at [redacted]w[redacted]d by LMP presenting for IOL given no dates for PD IOL. She reports +FMs, No LOF, no VB, no blurry vision, headaches or peripheral edema, and RUQ pain.  She plans on bottle feeding. She request IUD OP for birth control. She received her prenatal care at [facility], non since 26 weeks until see Dr. Shawnie Pons in MAU 9/13  Dating: By LMP --->  Estimated Date of Delivery: 03/29/23  Sono:    @20ww5d , CWD, normal anatomy, frank breech presentation, posterior placental lie, 388g   Prenatal History/Complications:  Patient Active Problem List   Diagnosis Date Noted   Anxiety, generalized 01/06/2015   Depression, major, recurrent, moderate (HCC) 01/06/2015   UTI (lower urinary tract infection) 12/08/2012   Bacterial vaginosis 12/08/2012    Past Medical History: Past Medical History:  Diagnosis Date   Anxiety    Depression     Past Surgical History: No past surgical history on file.  Obstetrical History: OB History     Gravida  4   Para  3   Term  3   Preterm      AB      Living  3      SAB      IAB      Ectopic      Multiple      Live Births  3           Social History Social History   Socioeconomic History   Marital status: Single    Spouse name: Not on file   Number of children: Not on file   Years of education: Not on file   Highest education level: Not on file  Occupational History   Not on file  Tobacco Use   Smoking status: Every Day    Current packs/day: 1.00    Average packs/day: 1 pack/day for 18.0 years (18.0 ttl pk-yrs)    Types: Cigarettes    Start date: 04/05/2005   Smokeless tobacco: Never  Vaping Use   Vaping status: Never Used  Substance and Sexual Activity   Alcohol use: Not Currently    Alcohol/week: 6.0 standard drinks of alcohol    Types: 6 Shots of liquor per week   Drug use: No   Sexual activity: Yes    Birth  control/protection: None  Other Topics Concern   Not on file  Social History Narrative   Not on file   Social Determinants of Health   Financial Resource Strain: Low Risk  (12/05/2020)   Received from [facility]   Overall Financial Resource Strain (CARDIA)    Difficulty of Paying Living Expenses: Not hard at all  Food Insecurity: No Food Insecurity (03/26/2021)   Received from [facility]   Hunger Vital Sign    Worried About Running Out of Food in the Last Year: Never true    Ran Out of Food in the Last Year: Never true  Transportation Needs: No Transportation Needs (12/05/2020)   Received from [facility] - Transportation    Lack of Transportation (Medical): No    Lack of Transportation (Non-Medical): No  Physical Activity: Sufficiently Active (12/05/2020)   Received [facility]   Exercise Vital Sign    Days of Exercise per Week: 5 days    Minutes of Exercise per Session: 30 min  Stress: No Stress Concern Present (12/05/2020)   Received from [facility]  [employer] of Occupational [facility] - Occupational Stress Questionnaire    Feeling of Stress : Not at all  Social Connections: Unknown (11/27/2021)   Received from [facility]   Social Network    Social Network: Not on file    Family History: Family History  Problem Relation Age of Onset   Diabetes Mother    Heart attack Mother    Hypertension Mother    Alcohol abuse Father    Anxiety disorder Sister    Panic disorder Sister     Allergies: Allergies  Allergen Reactions   Sulfa Antibiotics Hives and Shortness Of Breath    Medications Prior to Admission  Medication Sig Dispense Refill Last Dose   Prenatal Vit-Fe Fumarate-FA (PRENATAL MULTIVITAMIN) TABS tablet Take 1 tablet by mouth daily at 12 noon.        Review of Systems   All systems reviewed and negative except as stated in HPI  Last menstrual period 06/29/2022. General appearance: alert, cooperative, and appears stated  age Lungs: clear to auscultation bilaterally Heart: regular rate and rhythm Abdomen: soft, non-tender; bowel sounds normal Pelvic: normal female genitalia  Extremities: Homans sign is negative, no sign of DVT Presentation: cephalic Fetal monitoringBaseline: 125 bpm, Variability: Fair (1-6 bpm), Accelerations: Reactive, and Decelerations: Absent Uterine activity uterine irritability with rare CTX      Prenatal labs: ABO, Rh:   Antibody:   Rubella:   RPR:    HBsAg:    HIV:    GBS: NEGATIVE/-- (09/13 1737)  1 hr Glucola failed (132)  Genetic screening  reported normal Anatomy US wnl  Prenatal Transfer Tool  Maternal Diabetes: Yes:  Diabetes Type:  Diet controlled Genetic Screening: Normal Maternal Ultrasounds/Referrals: Normal Fetal Ultrasounds or other Referrals:  None Maternal Substance Abuse:  No Significant Maternal Medications:  None Significant Maternal Lab Results:  Other: GBS pending Number of Prenatal Visits:Less than or equal to 3 verified prenatal visits Other Comments:   Transfer from [place], [place]   Results for orders placed or performed during the hospital encounter of 03/28/23 (from the past 24 hour(s))  Fern Test   Collection Time: 03/28/23  5:22 PM  Result Value Ref Range   POCT Fern Test    Glucose, capillary   Collection Time: 03/28/23  5:34 PM  Result Value Ref Range   Glucose-Capillary 113 (H) 70 - 99 mg/dL  Group B strep by PCR   Collection Time: 03/28/23  5:37 PM   Specimen: Vaginal/Rectal; Genital  Result Value Ref Range   Group B strep by PCR NEGATIVE NEGATIVE    Patient Active Problem List   Diagnosis Date Noted   Anxiety, generalized 01/06/2015   Depression, major, recurrent, moderate (HCC) 01/06/2015   UTI (lower urinary tract infection) 12/08/2012   Bacterial vaginosis 12/08/2012    Assessment/Plan:  Chelsea Joseph is a 32 y.o. G4P3003 at [redacted]w[redacted]d here for IOL given no post date IOL available  #Labor:Unfavorable cervix, will  start with vaginal cytotec, no oral given uterine irritability on toco.  #Pain: Planning epidural later #FWB: Cat 1 #ID:  GBS PCR pending  #MOF: bottle #MOC:IUD OP   Hessie Dibble, MD  03/29/2023, 3:27 AM

## 2023-03-29 NOTE — Progress Notes (Signed)
Labor Progress Note Chelsea Joseph is a 32 y.o. G4P3003 at [redacted]w[redacted]d presented for IOL for post dates S: Coping well, yet anxious. Difficulty with cervical exam. Discussed risks and benefits of foley balloon for efficiency of labor, patient apprehensive but agreeable to a trial. Offered IV pain medications prior to placement, declined.   O:  BP 119/72   Pulse 70   Temp 98 F (36.7 C) (Oral)   Resp 16   Ht 5\' 4"  (1.626 m)   Wt 98 kg   LMP 06/29/2022 (Approximate)   BMI 37.08 kg/m  EFM: 120/moderate variability/accels present/no decels  CVE: Dilation: 1.5 Effacement (%): Thick Station: Ballotable Presentation: Vertex Exam by:: Dr. Leanora Joseph   A&P: 32 y.o. G6Y4034 [redacted]w[redacted]d IOL for post dates #Labor: Progressing slowly, though only had a single vaginal dose of Cytotec. FB attempted, unsuccessful. Will give dual dose Cytotec 50/25 and reassess in 4 hours.  #Pain: Epidural on request, though dicussed benefits of movement in latent labor. IV fentanyl and/or Nitrous on request. #FWB: Cat I #GBS negative   Wyn Forster, MD 10:19 AM

## 2023-03-29 NOTE — Anesthesia Procedure Notes (Signed)
Epidural Patient location during procedure: OB Start time: 03/29/2023 4:05 PM End time: 03/29/2023 4:16 PM  Staffing Anesthesiologist: Atilano Median, DO Performed: anesthesiologist   Preanesthetic Checklist Completed: patient identified, IV checked, site marked, risks and benefits discussed, surgical consent, monitors and equipment checked, pre-op evaluation and timeout performed  Epidural Patient position: sitting Prep: ChloraPrep Patient monitoring: heart rate, continuous pulse ox and blood pressure Approach: midline Location: L3-L4 Injection technique: LOR saline  Needle:  Needle type: Tuohy  Needle gauge: 17 G Needle length: 9 cm Needle insertion depth: 7 cm Catheter type: closed end flexible Catheter size: 20 Guage Catheter at skin depth: 12 cm Test dose: negative and 1.5% lidocaine with Epi 1:200 K  Assessment Events: blood not aspirated, no cerebrospinal fluid, injection not painful, no injection resistance and no paresthesia  Additional Notes Patient identified. Risks/Benefits/Options discussed with patient including but not limited to bleeding, infection, nerve damage, paralysis, failed block, incomplete pain control, headache, blood pressure changes, nausea, vomiting, reactions to medications, itching and postpartum back pain. Confirmed with bedside nurse the patient's most recent platelet count. Confirmed with patient that they are not currently taking any anticoagulation, have any bleeding history or any family history of bleeding disorders. Patient expressed understanding and wished to proceed. All questions were answered. Sterile technique was used throughout the entire procedure. Please see nursing notes for vital signs. Test dose was given through epidural catheter and negative prior to continuing to dose epidural or start infusion. Warning signs of high block given to the patient including shortness of breath, tingling/numbness in hands, complete motor block,  or any concerning symptoms with instructions to call for help. Patient was given instructions on fall risk and not to get out of bed. All questions and concerns addressed with instructions to call with any issues or inadequate analgesia.    Reason for block:procedure for pain

## 2023-03-29 NOTE — Discharge Summary (Signed)
Postpartum Discharge Summary  Date of Service updated***     Patient Name: Chelsea Joseph DOB: 12-05-1990 MRN: [id number]  Date of admission: 03/29/2023 Delivery date:03/29/2023 Delivering provider: Wyn Forster Date of discharge: 03/29/2023  Admitting diagnosis: Encounter for induction of labor [Z34.90] Intrauterine pregnancy: [redacted]w[redacted]d     Secondary diagnosis:  Principal Problem:   Encounter for induction of labor  Additional problems: Anxiety, depression    Discharge diagnosis: Term Pregnancy Delivered                                              Post partum procedures:{Postpartum procedures:23558} Augmentation: AROM, Pitocin, and Cytotec Complications: None  Hospital course: Onset of Labor With Vaginal Delivery      32 y.o. yo [id number] at [redacted]w[redacted]d was admitted in Latent Labor on 03/29/2023. Labor course was uncomplicated. Membrane Rupture Time/Date: 8:00 PM,03/29/2023  Delivery Method:Vaginal, Spontaneous Operative Delivery:N/A Episiotomy: None Lacerations:   None Patient had a postpartum course complicated by ***.  She is ambulating, tolerating a regular diet, passing flatus, and urinating well. Patient is discharged home in stable condition on 03/29/23.  Newborn Data: Birth date:03/29/2023 Birth time:8:15 PM Gender:Female Living status:Living Apgars:8 ,9  Weight:   Magnesium Sulfate received: {Mag received:30440022} BMZ received: No Rhophylac:{Rhophylac received:30440032} MMR:{MMR:30440033} T-DaP:{Tdap:23962} Flu: {JYN:82956} Transfusion:{Transfusion received:30440034}  Physical exam  Vitals:   03/29/23 1701 03/29/23 1735 03/29/23 1801 03/29/23 1831  BP: 113/79 118/62 127/64 115/73  Pulse: 69 72 68 70  Resp:      Temp:      TempSrc:      SpO2:      Weight:      Height:       General: {Exam; general:21111117} Lochia: {Desc; appropriate/inappropriate:30686::"appropriate"} Uterine Fundus: {Desc; firm/soft:30687} Incision: {Exam; incision:21111123} DVT  Evaluation: {Exam; dvt:2111122} Labs: Lab Results  Component Value Date   WBC 10.6 (H) 03/29/2023   HGB 10.8 (L) 03/29/2023   HCT 32.8 (L) 03/29/2023   MCV 90.6 03/29/2023   PLT 203 03/29/2023      Latest Ref Rng & Units 02/21/2019    1:55 AM  CMP  Glucose 70 - 99 mg/dL 213   BUN 6 - 20 mg/dL 6   Creatinine 0.86 - 5.78 mg/dL 4.69   Sodium 629 - 528 mmol/L 138   Potassium 3.5 - 5.1 mmol/L 3.8   Chloride 98 - 111 mmol/L 102   CO2 22 - 32 mmol/L 28   Calcium 8.9 - 10.3 mg/dL 9.4   Total Protein 6.5 - 8.1 g/dL 6.3   Total Bilirubin 0.3 - 1.2 mg/dL 0.5   Alkaline Phos 38 - 126 U/L 54   AST 15 - 41 U/L 13   ALT 0 - 44 U/L 15    Edinburgh Score:     No data to display           After visit meds:  Allergies as of 03/29/2023       Reactions   Sulfa Antibiotics Hives, Shortness Of Breath     Med Rec must be completed prior to using this [facility]***        Discharge home in stable condition Infant Feeding: Bottle Infant Disposition:home with mother Discharge instruction: per After Visit Summary and Postpartum booklet. Activity: Advance as tolerated. Pelvic rest for 6 weeks.  Diet: routine diet Future Appointments:No future appointments. Follow up Visit: Received care at [facility]  since 26 weeks   Please schedule this patient for a In person postpartum visit in 6 weeks with the following provider: Any provider. Additional Postpartum F/U: N/A   Low risk pregnancy complicated by:  N/A Delivery mode:  Vaginal, Spontaneous Anticipated Birth Control:  IUD outpatient   03/29/2023 Moody Bruins, MD

## 2023-03-29 NOTE — Progress Notes (Signed)
Labor Progress Note Chelsea Joseph is a 32 y.o. G4P3003 at [redacted]w[redacted]d presented for IOL for post dates  S: Coping well, yet anxious. Difficulty with cervical exams. Declined further FB or SVE until comfortable with epidural.  O:  BP (!) 106/58   Pulse 70   Temp 98.2 F (36.8 C) (Oral)   Resp 18   Ht 5\' 4"  (1.626 m)   Wt 98 kg   LMP 06/29/2022 (Approximate)   BMI 37.08 kg/m  EFM: 125/moderate/accels present/no decels  CVE: Dilation: 1.5 Effacement (%): Thick Station: Ballotable Presentation: Vertex Exam by:: Dr. Leanora Cover   A&P: 32 y.o. G4W1027 [redacted]w[redacted]d admitted for IOL for post dates #Labor: Declined SVE until comfortable with epidural.  #Pain: Epidural #FWB: Cat I #GBS negative   Wyn Forster, MD 4:13 PM

## 2023-03-29 NOTE — Anesthesia Preprocedure Evaluation (Signed)
Anesthesia Evaluation  Patient identified by MRN, date of birth, ID band Patient awake    Reviewed: Allergy & Precautions, NPO status , Patient's Chart, lab work & pertinent test results  Airway Mallampati: II  TM Distance: >3 FB Neck ROM: Full    Dental no notable dental hx.    Pulmonary neg pulmonary ROS, Current Smoker   Pulmonary exam normal        Cardiovascular negative cardio ROS  Rhythm:Regular Rate:Normal     Neuro/Psych   Anxiety Depression    negative neurological ROS     GI/Hepatic negative GI ROS, Neg liver ROS,,,  Endo/Other  negative endocrine ROS    Renal/GU negative Renal ROS  negative genitourinary   Musculoskeletal negative musculoskeletal ROS (+)    Abdominal Normal abdominal exam  (+)   Peds  Hematology Lab Results      Component                Value               Date                      WBC                      10.6 (H)            03/29/2023                HGB                      10.8 (L)            03/29/2023                HCT                      32.8 (L)            03/29/2023                MCV                      90.6                03/29/2023                PLT                      203                 03/29/2023              Anesthesia Other Findings   Reproductive/Obstetrics (+) Pregnancy                             Anesthesia Physical Anesthesia Plan  ASA: 2  Anesthesia Plan: Epidural   Post-op Pain Management:    Induction:   PONV Risk Score and Plan: 2 and Treatment may vary due to age or medical condition  Airway Management Planned: Natural Airway  Additional Equipment: None  Intra-op Plan:   Post-operative Plan:   Informed Consent: I have reviewed the patients History and Physical, chart, labs and discussed the procedure including the risks, benefits and alternatives for the proposed anesthesia with the patient or authorized  representative who has indicated his/her understanding and acceptance.  Dental advisory given  Plan Discussed with:   Anesthesia Plan Comments:        Anesthesia Quick Evaluation

## 2023-03-30 MED ORDER — OXYCODONE HCL 5 MG PO TABS
5.0000 mg | ORAL_TABLET | ORAL | Status: DC | PRN
  Administered 2023-03-30 – 2023-03-31 (×2): 5 mg via ORAL
  Filled 2023-03-30 (×2): qty 1

## 2023-03-30 NOTE — Progress Notes (Addendum)
CSW received consult due to score 13 on Edinburgh Depression Screen and MH hx.    When CSW arrived, MOB and FOB were resting in bed, MGM was resting on the couch and infant was asleep in bassinet. CSW offered to return at a later time and MOB declined. MOB gave CSW permission to complete assessment while her guest were present. MOB guest did not engage with CSW however, MOB was easy to engage, polite, and was receptive to CSW's visit.   CSW asked about MOB's MH hx.  MOB acknowledged having a dx of anxiety and communicated she was dx in 2016.  Per MOB she has tried various medications regiments however a this time she is not currently taking any medications no is he in therapy.    CSW reviewed MOB's Edinburgh Score and provided education regarding Baby Blues vs PMADs and provided MOB with resources for mental health follow up.  CSW encouraged MOB to evaluate her mental health throughout the postpartum period with the use of the New Mom Checklist developed by Postpartum Progress as well as the [place] Postnatal Depression Scale and notify a medical professional if symptoms arise.  MOB acknowledged having PPD symptoms with MOB's younger 2 children and she communicated she did  seek help from her OB provider. MOB presented with insight and awareness and did not display any acute MH symptoms. MOB shared having a good support team and she expressed feeling comfortable seeking help is needed. CSW assessed for safety and MOB denied SI and HI. CSW provided MOB with resources to outpatient counseling and CSW provided interventions to assist with PMADs.  MOB shared having all essential items to care for infant post discharge and expressed feeing prepared to meet all infant's needs.   MOB requesting follow-up care at [facility] as she communicated she is new to this area.  There are no barriers to discharge.  Blaine Hamper, MSW, LCSW Clinical Social Work [phone]

## 2023-03-30 NOTE — Progress Notes (Signed)
POSTPARTUM PROGRESS NOTE  Post Partum Day 1  Subjective:  Chelsea Joseph is a 32 y.o. B1Y7829 s/p SVD at [redacted]w[redacted]d.  She reports she is doing well. No acute events overnight. She denies any problems with ambulating, voiding or po intake. Denies nausea or vomiting.  Pain is well controlled.  Lochia is = menses.  Objective: Blood pressure 115/72, pulse 69, temperature 98 F (36.7 C), temperature source Oral, resp. rate 16, height 5\' 4"  (1.626 m), weight 98 kg, last menstrual period 06/29/2022, SpO2 98%, unknown if currently breastfeeding.  Physical Exam:  General: alert, cooperative and no distress Chest: no respiratory distress Heart:regular rate, distal pulses intact Uterine Fundus: firm, appropriately tender DVT Evaluation: No calf swelling or tenderness Extremities: Trace edema Skin: warm, dry  Recent Labs    03/29/23 0425  HGB 10.8*  HCT 32.8*    Assessment/Plan: Shilonda Camero is a 32 y.o. F6O1308 s/p SVD at [redacted]w[redacted]d   PPD#1 - Doing well  Routine postpartum care  Contraception: IUD OP Feeding: bottle Dispo: Plan for discharge 9/16.   LOS: 1 day   Hessie Dibble, MD OB Fellow  03/30/2023, 7:15 AM

## 2023-03-30 NOTE — Anesthesia Postprocedure Evaluation (Signed)
Anesthesia Post Note  Patient: Chelsea Joseph  Procedure(s) Performed: AN AD HOC LABOR EPIDURAL     Patient location during evaluation: Mother Baby Anesthesia Type: Epidural Level of consciousness: awake Pain management: satisfactory to patient Vital Signs Assessment: post-procedure vital signs reviewed and stable Respiratory status: spontaneous breathing Cardiovascular status: stable Anesthetic complications: no  No notable events documented.  Last Vitals:  Vitals:   03/30/23 0455 03/30/23 0827  BP: 115/72 123/62  Pulse:  (!) 59  Resp: 16 16  Temp: 36.7 C 36.8 C  SpO2: 98% 99%    Last Pain:  Vitals:   03/30/23 0827  TempSrc: Oral  PainSc:    Pain Goal: Patients Stated Pain Goal: 0 (03/29/23 1520)                 Cephus Shelling

## 2023-03-30 NOTE — Progress Notes (Signed)
Patient complaining of back pain at epidural insertion site aggravated by movement and ambulation and improved with ice packs. Epidural site is clean and dry with no erythema. There is moderate point tenderness of her lumbar spine and the presence of paraspinous muscle spasm. Patient reassured that pain should resolve in 7-10 days and continue ice packs for 2 days. Take Ibuprofen 600mg  q8h. Will give oxycodone 5mg  q4h prn while she is in hospital.She was instructed to call anesthesiologist on call if pain worsens or if she develops neurologic symptoms.

## 2023-03-30 NOTE — Progress Notes (Signed)
Educated parents on feeding infant every 3 hours and following feeding que's. Encouraged parents to call out to help with feedings as needed.

## 2023-03-31 MED ORDER — RHO D IMMUNE GLOBULIN 1500 UNIT/2ML IJ SOSY
300.0000 ug | PREFILLED_SYRINGE | Freq: Once | INTRAMUSCULAR | Status: DC
  Filled 2023-03-31: qty 2

## 2023-03-31 MED ORDER — SENNOSIDES-DOCUSATE SODIUM 8.6-50 MG PO TABS: 2.0000 | ORAL_TABLET | Freq: Two times a day (BID) | ORAL | 0 refills | Status: AC | PRN

## 2023-03-31 MED ORDER — PRENATAL MULTIVITAMIN CH: 1.0000 | ORAL_TABLET | Freq: Every day | ORAL | 11 refills | Status: AC

## 2023-03-31 MED ORDER — RHO D IMMUNE GLOBULIN 1500 UNIT/2ML IJ SOSY
300.0000 ug | PREFILLED_SYRINGE | Freq: Once | INTRAMUSCULAR | Status: AC
  Administered 2023-03-31: 300 ug via INTRAMUSCULAR
  Filled 2023-03-31: qty 2

## 2023-04-01 LAB — RH IG WORKUP (INCLUDES ABO/RH)
Fetal Screen: NEGATIVE
Gestational Age(Wks): 40
Unit division: 0

## 2023-04-26 ENCOUNTER — Telehealth (HOSPITAL_COMMUNITY): Payer: Self-pay

## 2023-04-26 NOTE — Telephone Encounter (Signed)
04/26/2023  Name: Chelsea Joseph MRN: [id number] DOB: Oct 17, 1990  Reason for Call:  Transition of Care Hospital Discharge Call  Contact Status: Patient Contact Status: Message  Language assistant needed: Interpreter Mode: Interpreter Not Needed        Follow-Up Questions:    Inocente Salles Postnatal Depression Scale:  In the Past 7 Days:    PHQ2-9 Depression Scale:     Discharge Follow-up:    Post-discharge interventions: NA  Signature  Signe Colt

## 2023-05-21 ENCOUNTER — Encounter: Payer: Self-pay | Admitting: Family Medicine

## 2023-05-21 ENCOUNTER — Ambulatory Visit: Payer: Medicaid Other | Admitting: Family Medicine

## 2023-05-21 ENCOUNTER — Other Ambulatory Visit (HOSPITAL_COMMUNITY)
Admission: RE | Admit: 2023-05-21 | Discharge: 2023-05-21 | Disposition: A | Discharge status: Home / Self Care | Payer: Medicaid Other | Source: Ambulatory Visit | Attending: Family Medicine | Admitting: Family Medicine

## 2023-05-21 VITALS — BP 120/76 | HR 67 | Wt 194.2 lb

## 2023-05-21 DIAGNOSIS — Z30430 Encounter for insertion of intrauterine contraceptive device: Secondary | ICD-10-CM | POA: Diagnosis not present

## 2023-05-21 DIAGNOSIS — F411 Generalized anxiety disorder: Secondary | ICD-10-CM

## 2023-05-21 DIAGNOSIS — Z124 Encounter for screening for malignant neoplasm of cervix: Secondary | ICD-10-CM | POA: Diagnosis present

## 2023-05-21 MED ORDER — HYDROXYZINE HCL 10 MG PO TABS
10.0000 mg | ORAL_TABLET | Freq: Three times a day (TID) | ORAL | 3 refills | Status: DC | PRN
Start: 2023-05-21 — End: 2023-11-12

## 2023-05-21 MED ORDER — BUSPIRONE HCL 7.5 MG PO TABS
7.5000 mg | ORAL_TABLET | Freq: Three times a day (TID) | ORAL | 3 refills | Status: DC
Start: 2023-05-21 — End: 2023-11-12

## 2023-05-21 MED ORDER — LEVONORGESTREL 20 MCG/DAY IU IUD
1.0000 | INTRAUTERINE_SYSTEM | Freq: Once | INTRAUTERINE | Status: AC
Start: 2023-05-21 — End: 2023-05-21
  Administered 2023-05-21: 1 via INTRAUTERINE

## 2023-05-21 NOTE — Progress Notes (Signed)
Post Partum Visit Note  Chelsea Joseph is a 32 y.o. G87P4004 female who presents for a postpartum visit. She is 8 weeks postpartum following a normal spontaneous vaginal delivery.  I have fully reviewed the prenatal and intrapartum course. The delivery was at [redacted]w[redacted]d gestational weeks.  Anesthesia: epidural. Postpartum course has been Well. Baby is doing well. Baby is feeding by bottle - Similac Neosure. Bleeding staining only. Bowel function is normal. Bladder function is normal. Patient is not sexually active. Contraception method is  Wants to discuss Mirena IUD . Postpartum depression screening: positive- Needing Help per patient.    The pregnancy intention screening data noted above was reviewed. Potential methods of contraception were discussed. The patient elected to proceed with No data recorded.   Edinburgh Postnatal Depression Scale - 05/21/23 1039       Edinburgh Postnatal Depression Scale:  In the Past 7 Days   I have been able to laugh and see the funny side of things. 2    I have looked forward with enjoyment to things. 2    I have blamed myself unnecessarily when things went wrong. 2    I have been anxious or worried for no good reason. 3    I have felt scared or panicky for no good reason. 2    Things have been getting on top of me. 2    I have been so unhappy that I have had difficulty sleeping. 2    I have felt sad or miserable. 2    I have been so unhappy that I have been crying. 2    The thought of harming myself has occurred to me. 0    Edinburgh Postnatal Depression Scale Total 19             Health Maintenance Due  Topic Date Due   Hepatitis C Screening  Never done   DTaP/Tdap/Td (1 - Tdap) Never done   INFLUENZA VACCINE  Never done   COVID-19 Vaccine (1 - 2023-24 season) Never done    The following portions of the patient's history were reviewed and updated as appropriate: allergies, current medications, past family history, past medical history, past  social history, past surgical history, and problem list.  Review of Systems Pertinent items noted in HPI and remainder of comprehensive ROS otherwise negative.  Objective:  BP 120/76   Pulse 67   Wt 194 lb 3.2 oz (88.1 kg)   LMP 06/29/2022 (Approximate)   BMI 33.33 kg/m    General:  alert, cooperative, and appears stated age   Breasts:  not indicated  Lungs: Normal effort  Heart:  regular rate and rhythm  Abdomen: soft, non-tender; bowel sounds normal; no masses,  no organomegaly   GU exam:  normal   Procedure: Patient identified, informed consent performed, signed copy in chart, time out was performed.  Urine pregnancy test negative.  Speculum placed in the vagina.  Cervix visualized.  Cleaned with Betadine x 2.  Grasped anteriourly with a single tooth tenaculum.  Uterus sounded to 8 cm.  Mirena IUD placed per manufacturer's recommendations.  Strings trimmed to 3 cm.   Patient given post procedure instructions and Mirena care card with expiration date.  Patient is asked to check IUD strings periodically and follow up in 4-6 weeks for IUD check.    Assessment:   Normal postpartum exam.   Plan:   Essential components of care per ACOG recommendations:  1.  Mood and well being: Patient with negative  depression screening today. Reviewed local resources for support.  - Patient tobacco use? Yes. Patient desires to quit? No.   - hx of drug use? No.    2. Infant care and feeding:  -Patient currently breastmilk feeding? No.  -Social determinants of health (SDOH) reviewed in EPIC. No concerns  3. Sexuality, contraception and birth spacing - Patient does not want a pregnancy in the next year.  Desired family size is 4 children.  - Reviewed reproductive life planning. Reviewed contraceptive methods based on pt preferences and effectiveness.  Patient desired IUD or IUS today.   - Discussed birth spacing of 18 months  4. Sleep and fatigue -Encouraged family/partner/community  support of 4 hrs of uninterrupted sleep to help with mood and fatigue  5. Physical Recovery  - Discussed patients delivery and complications. She describes her labor as good. - Patient had a Vaginal, no problems at delivery. Patient had no laceration. Perineal healing reviewed. Patient expressed understanding - Patient has urinary incontinence? No. - Patient is safe to resume physical and sexual activity  6.  Health Maintenance - HM due items addressed Yes - Last pap smear No results found for: "DIAGPAP" Pap smear done at today's visit.  -Breast Cancer screening indicated? No.   7. Chronic Disease/Pregnancy Condition follow up:  perinatal mood disorder Started on Buspar, vistaril, referral to Integrated behavioral health  Reva Bores, MD [facility], [facility]

## 2023-05-22 ENCOUNTER — Telehealth: Payer: Self-pay | Admitting: Clinical

## 2023-05-22 NOTE — Telephone Encounter (Signed)
Attempt call regarding referral; Left HIPPA-compliant message to call back Mikle Sternberg from [facility] at  [phone] (Ashelyn Mccravy's office).    

## 2023-05-23 LAB — CYTOLOGY - PAP
Comment: NEGATIVE
Diagnosis: NEGATIVE
High risk HPV: NEGATIVE
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
## 2023-06-23 ENCOUNTER — Ambulatory Visit: Payer: Medicaid Other | Admitting: Clinical

## 2023-06-23 DIAGNOSIS — Z658 Other specified problems related to psychosocial circumstances: Secondary | ICD-10-CM

## 2023-06-23 DIAGNOSIS — F411 Generalized anxiety disorder: Secondary | ICD-10-CM

## 2023-06-23 NOTE — Patient Instructions (Addendum)
Center for [facility] Healthcare at [facility] for Women [address] [place], [place] [address] [phone] (main office) [phone] ([place]'s office)  Wellspring (Caregiver Support) www.well-springsolutions.org  New Parent Support Groups www.postpartum.net www.conehealthybaby.com  [employer] www.womenscentergso.Danice Goltz (Low Income Risk manager) LowBlog.nl  [employer] (Low Income Automotive engineer) LittleDVDs.dk  [place] Child Counsellor  (Childcare options, Early childcare development, etc.) DietDisorder.cz  [employer] Child Care Facility Search Engine  https://ncchildcare.http://cook.com/  Transportation Resources [place] [employer] (GTA) [address] [place] [place], [place], [place] [address] https://www.Magee-Ravenel.gov/departments/transportation/gdot-divisions/[place]-transit-agency-public-transportation-division     Fixed-route bus services, including regional fare cards for PART, [place], [place], and WSTA buses.  Reduced fare bus ID's available for Medicaid, Medicare, and "orange card" recipients.  SCAT offers curb-to-curb and door-to-door bus services for people with disabilities who are unable to use a fixed-bus route; also offers a shared-ride program.   Helpful tips:  -Routes available online and physical maps available at the main bus hub lobby (each for a specific route) -Smartphone directions often include bus routes (see the "bus" icon, next to the "car" and "walk" icons) -Routes differ on weekends, evenings and holidays, so plan ahead!  -If you have Medicaid, Medicare, or orange card, plan to obtain a reduced-fare ID to save 50% on rides. Check  days and times to obtain an ID, and bring all necessary documents.   [employer] [place]) [address] [place], [place]. [address] [place], [place] [address] [phone] SpotApps.nl **Fixed-bus route services, and demand response bus service for older adults  Department of Social [facility] [address], [place], [place] [address] [phone] www.MysterySinger.com.cy  Wheels [address], [place], [place] [address] [phone] www.wheels4hope.org **REFERRAL NEEDED by specific agencies (see website), after meeting specified criteria only  [employer] for [employer]) [address] [place], [place] [address] [phone]  BuyingShow.es  *Regional fixed-bus routes between counties (example: [place] to [place]) and [employer] (serves [place], [place], [place], [place], [place], [place], [place], [place], [place], [place], [place], [place], and [place] counties) [address], [place], [place] [address] [phone] DeveloperU.ch  **Rental assistance, Home Rehabilitation,Weatherization Assistance Program, Chief Financial Officer, Housing Voucher Program  [employer] for Housing and [employer] Studies: Museum/gallery curator to residents of [place], [place], and [place] Make sure you have your documents ready, including:  (Household income verification: 2 months pay stubs, unemployment/social security award letter, statement of no income for all household members over 73) Photo ID for all household members over 18 Utility Bill/Rent Ledger/Lease: must show past due amount for utilities/rent, or the rental agreement if rent is current 2. Start your application online or by paper (in [place] or  [place]) at:     https://www.castaneda.biz/  3. Once you have completed the online application, you will get an email confirmation message from the county. Expect to hear back by phone or email at least 6-10 weeks from submitting your application.  4. While you wait:  Call [phone] to check in on your application Let your landlord know that you've applied. Your landlord will be asked to submit documents (W-9) during this application process. Payments will be made directly to the landlord/property management company and Engineer, maintenance or utility assistance for [employer], [place], and [place] [employer]  at https://rb.gy/dvxbfv Questions? Call or email Renee at [phone] or [email or web address]   Housing Resources [facility] [address] [place], [place], [place] [address] [phone] PhoneCaptions.ch **Programs include: Foreclosure Prevention and Housing Counseling, Healthy Homes/Tenant Advocacy, [facility] Prevention and Housing Assistance  Government [facility] [address], [place], [place] [address] [phone] www.PaintingEmporium.co.za **housing applications/recertification; tax payment relief/exemption under specific qualification  [facility] [address] [place], [place] [address] [phone] www.greensborourbanministry.org  **emergency and transitional housing, rent/mortgage assistance, utility assistance  [employer] [address], [place], [place] [address] [phone] https://chshousing.org **Home Ownership/Affordable Housing Program and [facility]  Housing Consultants Group [address], [place], [place] [address] [phone] PaidValue.com.cy **home buyer education courses, foreclosure prevention  [facility] [address]  [place], [place] [address] [phone] DefMagazine.is **Environmental Exposure Assessment (investigation of homes where either children or pregnant women with a confirmed elevated blood lead level reside)  [facility] of [facility] [address] [place], [place] [address] [phone] ShowReturn.ca **Home Expense Assistance/Repairs Program; offers home accessibility updates, such as ramps or bars in the bathroom  /Emotional Wellbeing Apps and Websites Here are a few free apps meant to help you to help yourself.  To find, try searching on the internet to see if the app is offered on Apple/Android devices. If your first choice doesn't come up on your device, the good news is that there are many choices! Play around with different apps to see which ones are helpful to you.    Calm This is an app meant to help increase calm feelings. Includes info, strategies, and tools for tracking your feelings.      Calm Harm  This app is meant to help with self-harm. Provides many 5-minute or 15-min coping strategies for doing instead of hurting yourself.       Healthy Minds Health Minds is a problem-solving tool to help deal with emotions and cope with stress you encounter wherever you are.      MindShift This app can help people cope with anxiety. Rather than trying to avoid anxiety, you can make an important shift and face it.      MY3  MY3 features a support system, safety plan and resources with the goal of offering a tool to use in a time of need.       My Life My Voice  This mood journal offers a simple solution for tracking your thoughts, feelings and moods. Animated emoticons can help identify your mood.       Relax Melodies Designed to help with sleep, on this app you can mix sounds and meditations for relaxation.      Smiling Mind Smiling Mind is meditation made easy: it's a simple tool  that helps put a smile on your mind.        Stop, Breathe & Think  A friendly, simple guide for people through meditations for mindfulness and compassion.  Stop, Breathe and Think Kids Enter your current feelings and choose a "mission" to help you cope. Offers videos for certain moods instead of just sound recordings.       Team Orange The goal of this tool is to help teens change how they think, act, and react. This app helps you focus on your own good feelings and experiences.      The United Stationers Box The United Stationers Box (VHB) contains simple  tools to help patients with coping, relaxation, distraction, and positive thinking.

## 2023-07-11 NOTE — BH Specialist Note (Signed)
 Integrated Behavioral Health via Telemedicine Visit  07/25/2023 Kinda Pottle [id number]  Number of Integrated Behavioral Health Clinician visits: 2- Second Visit  Session Start time: 1011   Session End time: 1014  Total time in minutes: 3   Referring Provider: Glenys Birk, MD Patient/Family location: Work [facility] Provider location: Center for [facility] at [facility] for Women  Pt did show up to video; will need to schedule for 08/08/23 at 9:15am, due to being called into work due to impending weather.   Warren JAYSON Mering, LCSW     06/23/2023    2:30 PM  Depression screen PHQ 2/9  Decreased Interest 1  Down, Depressed, Hopeless 0  PHQ - 2 Score 1  Altered sleeping 1  Tired, decreased energy 3  Change in appetite 2  Feeling bad or failure about yourself  0  Trouble concentrating 3  Moving slowly or fidgety/restless 0  Suicidal thoughts 0  PHQ-9 Score 10      06/23/2023    2:33 PM  GAD 7 : Generalized Anxiety Score  Nervous, Anxious, on Edge 3  Control/stop worrying 2  Worry too much - different things 1  Trouble relaxing 3  Restless 1  Easily annoyed or irritable 0  Afraid - awful might happen 1  Total GAD 7 Score 11

## 2023-07-25 ENCOUNTER — Ambulatory Visit: Payer: Medicaid Other | Admitting: Clinical

## 2023-07-25 DIAGNOSIS — Z658 Other specified problems related to psychosocial circumstances: Secondary | ICD-10-CM

## 2023-07-25 DIAGNOSIS — F411 Generalized anxiety disorder: Secondary | ICD-10-CM

## 2023-07-28 NOTE — BH Specialist Note (Signed)
Pt did not arrive to video visit and did not answer the phone; Left HIPPA-compliant message to call back Asher Muir from [employer] for [employer] at [facility] for Women at  [phone] [facility] office).  ?; left MyChart message for patient.  ? ?

## 2023-08-04 ENCOUNTER — Encounter (HOSPITAL_BASED_OUTPATIENT_CLINIC_OR_DEPARTMENT_OTHER): Payer: Self-pay | Admitting: Emergency Medicine

## 2023-08-04 ENCOUNTER — Other Ambulatory Visit: Payer: Self-pay

## 2023-08-04 ENCOUNTER — Emergency Department (HOSPITAL_BASED_OUTPATIENT_CLINIC_OR_DEPARTMENT_OTHER)
Admission: EM | Admit: 2023-08-04 | Discharge: 2023-08-04 | Disposition: A | Discharge status: Home / Self Care | Payer: Medicaid Other | Attending: Emergency Medicine | Admitting: Emergency Medicine

## 2023-08-04 DIAGNOSIS — Z20822 Contact with and (suspected) exposure to covid-19: Secondary | ICD-10-CM | POA: Diagnosis not present

## 2023-08-04 DIAGNOSIS — J101 Influenza due to other identified influenza virus with other respiratory manifestations: Secondary | ICD-10-CM | POA: Insufficient documentation

## 2023-08-04 DIAGNOSIS — R509 Fever, unspecified: Secondary | ICD-10-CM | POA: Diagnosis present

## 2023-08-04 LAB — RESP PANEL BY RT-PCR (RSV, FLU A&B, COVID)  RVPGX2
Influenza A by PCR: POSITIVE — AB
Influenza B by PCR: NEGATIVE
Resp Syncytial Virus by PCR: NEGATIVE
SARS Coronavirus 2 by RT PCR: NEGATIVE

## 2023-08-04 MED ORDER — MELOXICAM 15 MG PO TABS: 15.0000 mg | ORAL_TABLET | Freq: Every day | ORAL | 0 refills | Status: AC | PRN

## 2023-08-04 MED ORDER — CETIRIZINE-PSEUDOEPHEDRINE ER 5-120 MG PO TB12: 1.0000 | ORAL_TABLET | Freq: Every day | ORAL | 0 refills | Status: AC | PRN

## 2023-08-04 MED ORDER — TRIAMCINOLONE ACETONIDE 55 MCG/ACT NA AERO: 2.0000 | INHALATION_SPRAY | Freq: Every day | NASAL | 0 refills | Status: AC

## 2023-08-04 MED ORDER — BENZONATATE 100 MG PO CAPS: 100.0000 mg | ORAL_CAPSULE | Freq: Three times a day (TID) | ORAL | 0 refills | Status: AC | PRN

## 2023-08-04 NOTE — ED Provider Notes (Signed)
[place] EMERGENCY DEPARTMENT AT [facility] Provider Note   CSN: [id number] Arrival date & time: 08/04/23  1237     History  Chief Complaint  Patient presents with   Fever    Chelsea Joseph is a 33 y.o. female.   Fever   33 year old female presents emergency department with complaints of bodyaches, nasal congestion, chills, sore throat.  Symptom onset Friday.  Patient ill with son who has similar symptoms and became sick the day after her symptoms started.  Have been trying over-the-counter medications with some improvement of symptoms.  Denies any chest pain, shortness of breath, abdominal pain, nausea, vomiting, urinary symptoms, change in bowel habits.  Past medical history significant for anxiety, depression  Home Medications Prior to Admission medications   Medication Sig Start Date End Date Taking? Authorizing Provider  benzonatate (TESSALON) 100 MG capsule Take 1 capsule (100 mg total) by mouth 3 (three) times daily as needed. 08/04/23  Yes Sherian Maroon A, PA  cetirizine-pseudoephedrine (ZYRTEC-D) 5-120 MG tablet Take 1 tablet by mouth daily as needed for allergies or rhinitis. 08/04/23  Yes Sherian Maroon A, PA  meloxicam (MOBIC) 15 MG tablet Take 1 tablet (15 mg total) by mouth daily as needed for pain. 08/04/23  Yes Sherian Maroon A, PA  triamcinolone (NASACORT) 55 MCG/ACT AERO nasal inhaler Place 2 sprays into the nose daily. 08/04/23  Yes Sherian Maroon A, PA  busPIRone (BUSPAR) 7.5 MG tablet Take 1 tablet (7.5 mg total) by mouth 3 (three) times daily. 05/21/23   Reva Bores, MD  hydrOXYzine (ATARAX) 10 MG tablet Take 1 tablet (10 mg total) by mouth 3 (three) times daily as needed. 05/21/23   Reva Bores, MD  Prenatal Vit-Fe Fumarate-FA (PRENATAL MULTIVITAMIN) TABS tablet Take 1 tablet by mouth daily. 03/31/23   Hessie Dibble, MD  senna-docusate (SENOKOT-S) 8.6-50 MG tablet Take 2 tablets by mouth 2 (two) times daily as needed for mild constipation  or moderate constipation. 03/31/23   Hessie Dibble, MD  dicyclomine (BENTYL) 10 MG capsule Take 1 capsule (10 mg total) by mouth 3 (three) times daily as needed for up to 14 days for spasms. or abdominal pain 02/21/19 01/21/20  Loleta Rose, MD  omeprazole (PRILOSEC OTC) 20 MG tablet Take 1 tablet (20 mg total) by mouth daily. 02/21/19 01/21/20  Loleta Rose, MD  sucralfate (CARAFATE) 1 g tablet Take 1 tablet (1 g total) by mouth 4 (four) times daily as needed (for abdominal discomfort, nausea, and/or vomiting). 02/21/19 01/21/20  Loleta Rose, MD      Allergies    Bactrim [sulfamethoxazole-trimethoprim] and Sulfa antibiotics    Review of Systems   Review of Systems  Constitutional:  Positive for fever.  All other systems reviewed and are negative.   Physical Exam Updated Vital Signs BP 116/73 (BP Location: Right Arm)   Pulse (!) 105   Temp 98.3 F (36.8 C) (Oral)   Resp 18   Ht 5\' 5"  (1.651 m)   Wt 86.2 kg   SpO2 99%   BMI 31.62 kg/m  Physical Exam Vitals and nursing note reviewed.  Constitutional:      General: She is not in acute distress.    Appearance: She is well-developed.  HENT:     Head: Normocephalic and atraumatic.     Nose: Congestion and rhinorrhea present.     Mouth/Throat:     Comments: Moderate posterior erythema.  Uvula midline rise metrical phonation.  Tonsils 1+ bilaterally without exudate.  No  sublingual submandibular swelling.  No trismus. Eyes:     Conjunctiva/sclera: Conjunctivae normal.  Cardiovascular:     Rate and Rhythm: Normal rate and regular rhythm.     Heart sounds: No murmur heard. Pulmonary:     Effort: Pulmonary effort is normal. No respiratory distress.     Breath sounds: Normal breath sounds. No wheezing, rhonchi or rales.  Abdominal:     Palpations: Abdomen is soft.     Tenderness: There is no abdominal tenderness.  Musculoskeletal:        General: No swelling.     Cervical back: Neck supple.  Skin:    General: Skin is warm and dry.      Capillary Refill: Capillary refill takes less than 2 seconds.  Neurological:     Mental Status: She is alert.  Psychiatric:        Mood and Affect: Mood normal.     ED Results / Procedures / Treatments   Labs (all labs ordered are listed, but only abnormal results are displayed) Labs Reviewed  RESP PANEL BY RT-PCR (RSV, FLU A&B, COVID)  RVPGX2 - Abnormal; Notable for the following components:      Result Value   Influenza A by PCR POSITIVE (*)    All other components within normal limits    EKG None  Radiology No results found.  Procedures Procedures    Medications Ordered in ED Medications - No data to display  ED Course/ Medical Decision Making/ A&P                                 Medical Decision Making Risk OTC drugs. Prescription drug management.   This patient presents to the ED for concern of cough, body aches, nasal congestion, this involves an extensive number of treatment options, and is a complaint that carries with it a high risk of complications and morbidity.  The differential diagnosis includes influenza, COVID, RSV, pneumonia, viral URI, other   Co morbidities that complicate the patient evaluation  See HPI   Additional history obtained:  Additional history obtained from EMR External records from outside source obtained and reviewed including hospital records   Lab Tests:  I Ordered, and personally interpreted labs.  The pertinent results include: Viral panel positive for influenza A   Imaging Studies ordered:  N/a   Cardiac Monitoring: / EKG:  The patient was maintained on a cardiac monitor.  I personally viewed and interpreted the cardiac monitored which showed an underlying rhythm of: sinus rhythm   Consultations Obtained:  N/a   Problem List / ED Course / Critical interventions / Medication management  Influenza A Reevaluation of the patient showed that the patient stayed the same I have reviewed the patients home  medicines and have made adjustments as needed   Social Determinants of Health:  Cigarette use.  Denies illicit drug use.   Test / Admission - Considered:  Influenza a Vitals signs within normal range and stable throughout visit. Laboratory studies significant for: See above 22 female presents emergency department with complaints of cough, nasal congestion, body aches, sore throat.  Symptoms began Saturday.  Patient with son ill at bedside with similar symptoms that began the day after.  On exam, lungs clear to auscultation bilaterally.  Nasal congestion as well as posterior pharyngeal erythema present.  Low clinical suspicion for Ludwig angina, Lemierre's disease, retropharyngeal abscess, PTA.  Suspect patient symptoms likely secondary to viral  pharyngitis.  Patient with viral testing positive for influenza A which is most likely causing symptoms.  Low clinical suspicion for pneumonia.  Will treat with symptomatic therapy as described in AVS.  Close follow-up with PCP recommended for reevaluation.  Treatment plan discussed length with patient and she acknowledged understanding was agreeable to said plan.  Patient overall well-appearing, afebrile in no acute distress. Worrisome signs and symptoms were discussed with the patient, and the patient acknowledged understanding to return to the ED if noticed. Patient was stable upon discharge.          Final Clinical Impression(s) / ED Diagnoses Final diagnoses:  Influenza A    Rx / DC Orders      Peter Garter, PA 08/04/23 1420    Loetta Rough, MD 08/04/23 [phone]

## 2023-08-04 NOTE — ED Triage Notes (Signed)
C/o fever, body aches, and nasal congestion since Friday,. Denies SHOB.

## 2023-08-04 NOTE — ED Notes (Signed)

## 2023-08-04 NOTE — Discharge Instructions (Addendum)
As discussed, you did test positive for influenza A which is most likely causing symptoms.  Will send in cough suppressant to use as needed as well as recommend allergy medicine such as Zyrtec/Claritin/Allegra.  Will also recommend nasal steroid spray such as Nasacort/Flonase.  Continue use Tylenol/Motrin for any body aches, fevers.  Please do not hesitate to return if the worrisome signs and symptoms we discussed become apparent.

## 2023-08-08 ENCOUNTER — Ambulatory Visit: Payer: Medicaid Other | Admitting: Clinical

## 2023-09-10 ENCOUNTER — Ambulatory Visit: Payer: Medicaid Other | Admitting: Family Medicine

## 2023-09-10 ENCOUNTER — Encounter: Payer: Self-pay | Admitting: Family Medicine

## 2023-09-10 NOTE — Progress Notes (Signed)
 Patient did not keep appointment today. She may call to reschedule.

## 2023-10-28 ENCOUNTER — Encounter: Payer: Self-pay | Admitting: Family Medicine

## 2023-11-12 ENCOUNTER — Telehealth: Admitting: Family Medicine

## 2023-11-12 ENCOUNTER — Encounter: Payer: Self-pay | Admitting: Family Medicine

## 2023-11-12 DIAGNOSIS — F411 Generalized anxiety disorder: Secondary | ICD-10-CM | POA: Diagnosis not present

## 2023-11-12 MED ORDER — HYDROXYZINE HCL 10 MG PO TABS
10.0000 mg | ORAL_TABLET | Freq: Three times a day (TID) | ORAL | 3 refills | Status: DC | PRN
Start: 2023-11-12 — End: 2023-11-13

## 2023-11-12 MED ORDER — BUSPIRONE HCL 15 MG PO TABS
7.5000 mg | ORAL_TABLET | Freq: Two times a day (BID) | ORAL | 3 refills | Status: DC
Start: 2023-11-12 — End: 2023-11-13

## 2023-11-12 NOTE — Progress Notes (Signed)
    GYNECOLOGY VIRTUAL VISIT ENCOUNTER NOTE  Provider location: Center for [facility] Healthcare at [facility]   Patient location: Home  I connected with Chelsea Joseph on 11/12/23 at  4:10 PM EDT by MyChart Video Encounter and verified that I am speaking with the correct person using two identifiers.   I discussed the limitations, risks, security and privacy concerns of performing an evaluation and management service virtually and the availability of in person appointments. I also discussed with the patient that there may be a patient responsible charge related to this service. The patient expressed understanding and agreed to proceed.   History:  Chelsea Joseph is a 33 y.o. [phone] female being evaluated today for anxiety meds. She is on buspar  and atarax  as needed. Buspar  is at 7.5 and taking 15 mg q am and 7.5 mg q pm. She then takes the hydroxyzine  at hs as well. Would like increased dosing. Remains calm now and feels like mostly her mood and anxiety are well controlled. She denies any abnormal vaginal discharge, bleeding, pelvic pain or other concerns.       Past Medical History:  Diagnosis Date   Anxiety    Depression    No past surgical history on file. The following portions of the patient's history were reviewed and updated as appropriate: allergies, current medications, past family history, past medical history, past social history, past surgical history and problem list.   Health Maintenance:  Normal pap and negative HRHPV on 05/20/2024.   Review of Systems:  Pertinent items noted in HPI and remainder of comprehensive ROS otherwise negative.  Physical Exam:   General:  Alert, oriented and cooperative. Patient appears to be in no acute distress.  Mental Status: Normal mood and affect. Normal behavior. Normal judgment and thought content.   Respiratory: Normal respiratory effort, no problems with respiration noted  Rest of physical exam deferred due to type of  encounter  Labs and Imaging No results found for this or any previous visit (from the past 2 weeks). No results found.     Assessment and Plan:     Problem List Items Addressed This Visit       Unprioritized   Anxiety, generalized - Primary   On Buspar  and would like dosing increase. Move to 15 mg bid. Continue hydroxyzine  prn.      Relevant Medications   busPIRone  (BUSPAR ) 15 MG tablet   hydrOXYzine  (ATARAX ) 10 MG tablet   Other Visit Diagnoses       Generalized anxiety disorder       Relevant Medications   busPIRone  (BUSPAR ) 15 MG tablet   hydrOXYzine  (ATARAX ) 10 MG tablet            I discussed the assessment and treatment plan with the patient. The patient was provided an opportunity to ask questions and all were answered. The patient agreed with the plan and demonstrated an understanding of the instructions.   The patient was advised to call back or seek an in-person evaluation/go to the ED if the symptoms worsen or if the condition fails to improve as anticipated.  I provided 9 minutes of face-to-face time during this encounter. I also spent 16 minutes dedicated to the care of this patient including pre-visit review of records, post visit ordering of medications and appropriate tests or procedures, coordinating care and documenting this visit encounter.    Granville Layer, MD Center for [employer], [facility]

## 2023-11-12 NOTE — Assessment & Plan Note (Signed)
 On Buspar  and would like dosing increase. Move to 15 mg bid. Continue hydroxyzine  prn.

## 2023-11-13 ENCOUNTER — Other Ambulatory Visit: Payer: Self-pay | Admitting: *Deleted

## 2023-11-13 DIAGNOSIS — F411 Generalized anxiety disorder: Secondary | ICD-10-CM

## 2023-11-13 MED ORDER — HYDROXYZINE HCL 10 MG PO TABS: 10.0000 mg | ORAL_TABLET | Freq: Three times a day (TID) | ORAL | 3 refills | Status: AC | PRN

## 2023-11-13 MED ORDER — BUSPIRONE HCL 15 MG PO TABS: 7.5000 mg | ORAL_TABLET | Freq: Two times a day (BID) | ORAL | 3 refills | Status: AC

## 2023-11-13 NOTE — Progress Notes (Signed)
 RX sent to wrong pharmacy, resending to correct pharmacy

## 2023-12-10 ENCOUNTER — Ambulatory Visit: Admitting: Family Medicine

## 2024-03-28 ENCOUNTER — Other Ambulatory Visit: Payer: Self-pay

## 2024-03-28 ENCOUNTER — Encounter (HOSPITAL_BASED_OUTPATIENT_CLINIC_OR_DEPARTMENT_OTHER): Payer: Self-pay

## 2024-03-28 ENCOUNTER — Emergency Department (HOSPITAL_BASED_OUTPATIENT_CLINIC_OR_DEPARTMENT_OTHER)
Admission: EM | Admit: 2024-03-28 | Discharge: 2024-03-29 | Disposition: A | Discharge status: Home / Self Care | Attending: Emergency Medicine | Admitting: Emergency Medicine

## 2024-03-28 ENCOUNTER — Emergency Department (HOSPITAL_BASED_OUTPATIENT_CLINIC_OR_DEPARTMENT_OTHER)

## 2024-03-28 DIAGNOSIS — S61259A Open bite of unspecified finger without damage to nail, initial encounter: Secondary | ICD-10-CM | POA: Insufficient documentation

## 2024-03-28 DIAGNOSIS — W503XXA Accidental bite by another person, initial encounter: Secondary | ICD-10-CM | POA: Insufficient documentation

## 2024-03-28 DIAGNOSIS — L089 Local infection of the skin and subcutaneous tissue, unspecified: Secondary | ICD-10-CM

## 2024-03-28 DIAGNOSIS — M79645 Pain in left finger(s): Secondary | ICD-10-CM | POA: Diagnosis present

## 2024-03-28 MED ORDER — AMOXICILLIN-POT CLAVULANATE 875-125 MG PO TABS
1.0000 | ORAL_TABLET | Freq: Once | ORAL | Status: AC
  Administered 2024-03-28: 1 via ORAL
  Filled 2024-03-28: qty 1

## 2024-03-28 MED ORDER — HYDROCODONE-ACETAMINOPHEN 5-325 MG PO TABS: 1.0000 | ORAL_TABLET | ORAL | 0 refills | Status: AC | PRN

## 2024-03-28 MED ORDER — AMOXICILLIN-POT CLAVULANATE 875-125 MG PO TABS: 1.0000 | ORAL_TABLET | Freq: Two times a day (BID) | ORAL | 0 refills | Status: AC

## 2024-03-28 MED ORDER — HYDROCODONE-ACETAMINOPHEN 5-325 MG PO TABS
1.0000 | ORAL_TABLET | Freq: Once | ORAL | Status: AC
  Administered 2024-03-28: 1 via ORAL
  Filled 2024-03-28: qty 1

## 2024-03-28 NOTE — Discharge Instructions (Addendum)
 You were evaluated in the Emergency Department and after careful evaluation, we did not find any emergent condition requiring admission or further testing in the hospital.  Your exam/testing today is overall reassuring.  Recommend use of the Augmentin  antibiotic twice daily for the next week.  Continue the Motrin  as needed for pain.  Can use the Norco for significant pain.  Would recommend follow-up with Dr. Josefina the hand surgeon as soon as you are able for a wound recheck.  Call the office number provided.  Please return to the Emergency Department if you experience any worsening of your condition.   Thank you for allowing us  to be a part of your care.
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
